# Patient Record
Sex: Male | Born: 1965 | Race: Black or African American | Hispanic: No | Marital: Single | Smoking: Current every day smoker
Health system: Southern US, Community
[De-identification: ages and names within clinical notes are randomized; demographics above are authoritative.]

## PROBLEM LIST (undated history)

## (undated) ENCOUNTER — Ambulatory Visit: Admission: EM | Payer: Self-pay

## (undated) ENCOUNTER — Ambulatory Visit: Admission: EM | Payer: 59 | Source: Home / Self Care

## (undated) DIAGNOSIS — I10 Essential (primary) hypertension: Secondary | ICD-10-CM

## (undated) DIAGNOSIS — I517 Cardiomegaly: Secondary | ICD-10-CM

## (undated) DIAGNOSIS — C801 Malignant (primary) neoplasm, unspecified: Secondary | ICD-10-CM

## (undated) DIAGNOSIS — R7611 Nonspecific reaction to tuberculin skin test without active tuberculosis: Secondary | ICD-10-CM

## (undated) DIAGNOSIS — Z86711 Personal history of pulmonary embolism: Secondary | ICD-10-CM

## (undated) DIAGNOSIS — E7800 Pure hypercholesterolemia, unspecified: Secondary | ICD-10-CM

---

## 2020-03-20 ENCOUNTER — Other Ambulatory Visit: Payer: Self-pay

## 2020-03-20 ENCOUNTER — Ambulatory Visit
Admission: EM | Admit: 2020-03-20 | Discharge: 2020-03-20 | Disposition: A | Discharge status: Home / Self Care | Payer: Self-pay | Attending: Internal Medicine | Admitting: Internal Medicine

## 2020-03-20 DIAGNOSIS — I10 Essential (primary) hypertension: Secondary | ICD-10-CM

## 2020-03-20 HISTORY — DX: Essential (primary) hypertension: I10

## 2020-03-20 HISTORY — DX: Pure hypercholesterolemia, unspecified: E78.00

## 2020-03-20 MED ORDER — ATORVASTATIN CALCIUM 10 MG PO TABS
10.0000 mg | ORAL_TABLET | Freq: Every day | ORAL | 1 refills | Status: DC
Start: 2020-03-20 — End: 2020-10-31

## 2020-03-20 MED ORDER — ATENOLOL 25 MG PO TABS: 25.0000 mg | ORAL_TABLET | Freq: Every day | ORAL | 2 refills | Status: AC

## 2020-03-20 MED ORDER — AMLODIPINE BESYLATE 10 MG PO TABS: 10.0000 mg | ORAL_TABLET | Freq: Every day | ORAL | 2 refills | Status: DC

## 2020-03-20 NOTE — ED Triage Notes (Addendum)
Pt reports he moved from [facility] and hasn't established care here. Takes blood pressure meds but lately his B/P has been creeping up and is now 170/110 today. States he would like more medication. Today his hands and lips felt tingly. States he took 6 of his pills today instead of the 1 he was supposed to take

## 2020-03-20 NOTE — ED Provider Notes (Addendum)
[facility]    CSN: [id number] Arrival date & time: 03/20/20  1533      History   Chief Complaint Chief Complaint  Patient presents with  . Hypertension    HPI Tony Graves is a 54 y.o. male with a history of high blood pressure on atenolol 25 mg orally daily comes to the urgent care for blood pressure medication adjustment.  Patient was found to have elevated blood pressure during preemployment screen.  He is compliant with his medications.  He denies any headaches, chest pain or abdominal pain.  No dizziness, near syncope or syncopal episodes.   HPI  Past Medical History:  Diagnosis Date  . High cholesterol   . Hypertension     There are no problems to display for this patient.   History reviewed. No pertinent surgical history.     Home Medications    Prior to Admission medications   Medication Sig Start Date End Date Taking? Authorizing Provider  amLODipine (NORVASC) 10 MG tablet Take 1 tablet (10 mg total) by mouth daily. 03/20/20   Chase Picket, MD  atenolol (TENORMIN) 25 MG tablet Take 1 tablet (25 mg total) by mouth daily. 03/20/20   Li Fragoso, Myrene Galas, MD  atorvastatin (LIPITOR) 10 MG tablet Take 1 tablet (10 mg total) by mouth daily. 03/20/20   LampteyMyrene Galas, MD    Family History Family History  Problem Relation Age of Onset  . Hypertension Mother   . Stroke Mother   . Cancer Father     Social History Social History   Tobacco Use  . Smoking status: Current Every Day Smoker    Packs/day: 0.50    Types: Cigarettes  . Smokeless tobacco: Never Used  Vaping Use  . Vaping Use: Never used  Substance Use Topics  . Alcohol use: Yes    Comment: social  . Drug use: Never     Allergies   Lisinopril and Penicillins   Review of Systems Review of Systems  Constitutional: Negative.   HENT: Negative.   Respiratory: Negative.   Cardiovascular: Negative.   Gastrointestinal: Negative.      Physical Exam Triage Vital Signs ED  Triage Vitals  Enc Vitals Group     BP 03/20/20 1651 (!) 188/91     Pulse Rate 03/20/20 1651 66     Resp 03/20/20 1651 18     Temp 03/20/20 1651 98.3 F (36.8 C)     Temp Source 03/20/20 1651 Oral     SpO2 03/20/20 1651 100 %     Weight 03/20/20 1652 190 lb (86.2 kg)     Height 03/20/20 1652 5\' 11"  (1.803 m)     Head Circumference --      Peak Flow --      Pain Score 03/20/20 1651 0     Pain Loc --      Pain Edu? --      Excl. in [place]? --    No data found.  Updated Vital Signs BP (!) 188/91 (BP Location: Left Arm)   Pulse 66   Temp 98.3 F (36.8 C) (Oral)   Resp 18   Ht 5\' 11"  (1.803 m)   Wt 86.2 kg   SpO2 100%   BMI 26.50 kg/m   Visual Acuity Right Eye Distance:   Left Eye Distance:   Bilateral Distance:    Right Eye Near:   Left Eye Near:    Bilateral Near:     Physical Exam Vitals and  nursing note reviewed.  Constitutional:      General: He is not in acute distress.    Appearance: Normal appearance. He is not ill-appearing.  Cardiovascular:     Rate and Rhythm: Normal rate and regular rhythm.     Pulses: Normal pulses.     Heart sounds: Normal heart sounds.  Pulmonary:     Effort: Pulmonary effort is normal.     Breath sounds: Normal breath sounds.  Musculoskeletal:     Cervical back: Normal range of motion.  Neurological:     Mental Status: He is alert.      UC Treatments / Results  Labs (all labs ordered are listed, but only abnormal results are displayed) Labs Reviewed - No data to display  EKG   Radiology No results found.  Procedures Procedures (including critical care time)  Medications Ordered in UC Medications - No data to display  Initial Impression / Assessment and Plan / UC Course  I have reviewed the triage vital signs and the nursing notes.  Pertinent labs & imaging results that were available during my care of the patient were reviewed by me and considered in my medical decision making (see chart for details).     1.   Uncontrolled hypertension: Atenolol and atorvastatin refilled Amlodipine 10 mg orally daily Return precautions given Decrease salt intake to 2 g/day Patient encouraged to exercise on a regular basis. Patient is encouraged to establish with a PCP for routine medical care. Final Clinical Impressions(s) / UC Diagnoses   Final diagnoses:  Uncontrolled hypertension     Discharge Instructions     Take medications as tolerated.   ED Prescriptions    Medication Sig Dispense Auth. Provider   amLODipine (NORVASC) 10 MG tablet Take 1 tablet (10 mg total) by mouth daily. 90 tablet Zyria Fiscus, Myrene Galas, MD   atenolol (TENORMIN) 25 MG tablet Take 1 tablet (25 mg total) by mouth daily. 90 tablet Omarius Grantham, Myrene Galas, MD   atorvastatin (LIPITOR) 10 MG tablet Take 1 tablet (10 mg total) by mouth daily. 90 tablet Taft Worthing, Myrene Galas, MD     PDMP not reviewed this encounter.   Chase Picket, MD 03/20/20 1807    Chase Picket, MD 03/20/20 [phone]

## 2020-03-20 NOTE — Discharge Instructions (Signed)
Take medications as tolerated.

## 2020-04-25 ENCOUNTER — Ambulatory Visit
Admission: EM | Admit: 2020-04-25 | Discharge: 2020-04-25 | Disposition: A | Discharge status: Home / Self Care | Payer: Self-pay | Attending: Emergency Medicine | Admitting: Emergency Medicine

## 2020-04-25 ENCOUNTER — Other Ambulatory Visit: Payer: Self-pay

## 2020-04-25 DIAGNOSIS — I10 Essential (primary) hypertension: Secondary | ICD-10-CM

## 2020-04-25 MED ORDER — HYDROCHLOROTHIAZIDE 25 MG PO TABS: 25.0000 mg | ORAL_TABLET | Freq: Every day | ORAL | 2 refills | Status: DC

## 2020-04-25 NOTE — Discharge Instructions (Addendum)
Stop taking the amlodipine.  Start taking hydrochlorothiazide, 25 mg daily.  Only take this 1 tablet once a day.  Continue your atenolol and atorvastatin.  I have made a referral to help you find a primary care provider.  If you develop chest pain, or if you pass out, you need to go to the ER for evaluation.

## 2020-04-25 NOTE — ED Triage Notes (Signed)
Patient states that he has been noticing his blood pressure was elevated last month and started doubling his BP meds. States that he has now started noticing bilateral leg swelling over the weekend. States that he does stand a lot at work too.

## 2020-04-25 NOTE — ED Provider Notes (Signed)
[facility]    CSN: [id number] Arrival date & time: 04/25/20  0831      History   Chief Complaint Chief Complaint  Patient presents with  . Leg Swelling    bilateral    HPI Tony Graves is a 54 y.o. male.   54 year old male here for evaluation of.  Patient reports that he is noticed that his blood pressure has been going up over the past month and he was taking more of his Norvasc than was prescribed. Patient reports that he would have a diastolic over 956 and was taken 2 Norvasc in the morning, 2 Norvasc the afternoon, and 2 Norvasc at night. After he started taking the increased dose of the Norvasc he began to notice swelling in his legs. Patient denies chest pain, headache, shortness of breath, dizziness, syncope. 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 Patient does not wear any compression socks. Patient does not currently have a PCP as he does not have insurance.     Past Medical History:  Diagnosis Date  . High cholesterol   . Hypertension     There are no problems to display for this patient.   History reviewed. No pertinent surgical history.     Home Medications    Prior to Admission medications   Medication Sig Start Date End Date Taking? Authorizing Provider  atenolol (TENORMIN) 25 MG tablet Take 1 tablet (25 mg total) by mouth daily. 03/20/20  Yes Lamptey, Myrene Galas, MD  atorvastatin (LIPITOR) 10 MG tablet Take 1 tablet (10 mg total) by mouth daily. 03/20/20  Yes Lamptey, Myrene Galas, MD  amLODipine (NORVASC) 10 MG tablet Take 1 tablet (10 mg total) by mouth daily. 03/20/20 04/25/20 Yes Lamptey, Myrene Galas, MD  hydrochlorothiazide (HYDRODIURIL) 25 MG tablet Take 1 tablet (25 mg total) by mouth daily. 04/25/20   Margarette Canada, NP    Family History Family History  Problem Relation Age of Onset  . Hypertension Mother   . Stroke Mother   . Cancer Father     Social History Social  History   Tobacco Use  . Smoking status: Current Every Day Smoker    Packs/day: 0.50    Types: Cigarettes  . Smokeless tobacco: Never Used  Vaping Use  . Vaping Use: Never used  Substance Use Topics  . Alcohol use: Yes    Comment: social  . Drug use: Never     Allergies   Lisinopril and Penicillins   Review of Systems Review of Systems  Constitutional: Negative for activity change, appetite change and fever.  HENT: Negative for ear pain and sore throat.   Respiratory: Negative for chest tightness, shortness of breath and wheezing.   Cardiovascular: Positive for leg swelling. Negative for chest pain and palpitations.  Musculoskeletal: Negative for joint swelling.  Skin: Negative.   Neurological: Negative for dizziness, syncope, weakness, light-headedness, numbness and headaches.  Hematological: Negative.   Psychiatric/Behavioral: Negative.      Physical Exam Triage Vital Signs ED Triage Vitals  Enc Vitals Group     BP 04/25/20 0906 (!) 140/96     Pulse Rate 04/25/20 0906 61     Resp 04/25/20 0906 17     Temp 04/25/20 0906 98.1 F (36.7 C)     Temp Source 04/25/20 0906 Oral     SpO2 04/25/20 0906 99 %     Weight 04/25/20 0905 190 lb 0.6 oz (86.2  kg)     Height 04/25/20 0905 5\' 11"  (1.803 m)     Head Circumference --      Peak Flow --      Pain Score 04/25/20 0905 0     Pain Loc --      Pain Edu? --      Excl. in [place]? --    No data found.  Updated Vital Signs BP (!) 140/96 (BP Location: Right Arm)   Pulse 61   Temp 98.1 F (36.7 C) (Oral)   Resp 17   Ht 5\' 11"  (1.803 m)   Wt 190 lb 0.6 oz (86.2 kg)   SpO2 99%   BMI 26.50 kg/m   Visual Acuity Right Eye Distance:   Left Eye Distance:   Bilateral Distance:    Right Eye Near:   Left Eye Near:    Bilateral Near:     Physical Exam Vitals and nursing note reviewed.  Constitutional:      General: He is not in acute distress.    Appearance: Normal appearance. He is normal weight. He is not  toxic-appearing.  HENT:     Head: Normocephalic and atraumatic.  Eyes:     General: No scleral icterus.    Extraocular Movements: Extraocular movements intact.     Conjunctiva/sclera: Conjunctivae normal.     Pupils: Pupils are equal, round, and reactive to light.  Neck:     Vascular: No carotid bruit.  Cardiovascular:     Rate and Rhythm: Normal rate and regular rhythm.     Pulses: Normal pulses.     Heart sounds: Normal heart sounds. No murmur heard.  No friction rub. No gallop.   Pulmonary:     Effort: Pulmonary effort is normal.     Breath sounds: Normal breath sounds. No wheezing or rales.  Musculoskeletal:        General: No tenderness. Normal range of motion.     Cervical back: Normal range of motion and neck supple.     Right lower leg: Edema present.     Left lower leg: Edema present.     Comments: Patient has 1+ pitting edema in bilateral lower extremities. The edema extends to mid tibia bilaterally.  Skin:    General: Skin is warm and dry.     Capillary Refill: Capillary refill takes less than 2 seconds.  Neurological:     General: No focal deficit present.     Mental Status: He is alert and oriented to person, place, and time.     Cranial Nerves: No cranial nerve deficit.     Sensory: No sensory deficit.     Motor: No weakness.     Coordination: Coordination normal.     Gait: Gait normal.  Psychiatric:        Mood and Affect: Mood normal.        Behavior: Behavior normal.        Thought Content: Thought content normal.        Judgment: Judgment normal.      UC Treatments / Results  Labs (all labs ordered are listed, but only abnormal results are displayed) Labs Reviewed - No data to display  EKG   Radiology No results found.  Procedures Procedures (including critical care time)  Medications Ordered in UC Medications - No data to display  Initial Impression / Assessment and Plan / UC Course  I have reviewed the triage vital signs and the  nursing notes.  Pertinent labs & imaging  results that were available during my care of the patient were reviewed by me and considered in my medical decision making (see chart for details).   Patient is here for evaluation of bilateral lower extremity swelling after starting Norvasc. Patient states that his blood pressure had been elevated to the point of a diastolic over 762 so he was doubling and tripling up on his Norvasc. Patient denies rash, syncope, chest pain, headache, dizziness, shortness of breath. Patient reports that he has resumed taking the Norvasc as prescribed, 1 tablet daily. Patient is also taking atenolol 25 mg daily and atorvastatin. Patient has not yet established with a primary care provider as he is still awaiting to qualify for insurance with his job. We'll send a referral to assist him in finding a primary care provider. Will obtain EKG to look for signs of arrhythmia or formation of heart block. Will DC amlodipine and start patient on hydrochlorothiazide 25 mg daily. Patient will be referred for assistance in finding a PCP.   EKG shows sinus bradycardia with a rate of 55.  There are no ST changes or AV blocks present on EKG.  EKG is normal axis.  PR interval is at the maximum limit of normal.  Final Clinical Impressions(s) / UC Diagnoses   Final diagnoses:  Primary hypertension     Discharge Instructions     Stop taking the amlodipine.  Start taking hydrochlorothiazide, 25 mg daily.  Only take this 1 tablet once a day.  Continue your atenolol and atorvastatin.  I have made a referral to help you find a primary care provider.  If you develop chest pain, or if you pass out, you need to go to the ER for evaluation.    ED Prescriptions    Medication Sig Dispense Auth. Provider   hydrochlorothiazide (HYDRODIURIL) 25 MG tablet Take 1 tablet (25 mg total) by mouth daily. 30 tablet Margarette Canada, NP     PDMP not reviewed this encounter.   Margarette Canada, NP 04/25/20  1002

## 2020-07-14 ENCOUNTER — Telehealth: Payer: Self-pay

## 2020-07-14 MED ORDER — HYDROCHLOROTHIAZIDE 25 MG PO TABS
25.0000 mg | ORAL_TABLET | Freq: Every day | ORAL | 0 refills | Status: DC
Start: 2020-07-14 — End: 2020-09-24

## 2020-09-24 ENCOUNTER — Other Ambulatory Visit: Payer: Self-pay

## 2020-09-24 ENCOUNTER — Encounter: Payer: Self-pay | Admitting: Emergency Medicine

## 2020-09-24 ENCOUNTER — Ambulatory Visit
Admission: EM | Admit: 2020-09-24 | Discharge: 2020-09-24 | Disposition: A | Discharge status: Home / Self Care | Payer: 59 | Attending: Internal Medicine | Admitting: Internal Medicine

## 2020-09-24 DIAGNOSIS — E871 Hypo-osmolality and hyponatremia: Secondary | ICD-10-CM | POA: Diagnosis present

## 2020-09-24 DIAGNOSIS — I10 Essential (primary) hypertension: Secondary | ICD-10-CM | POA: Diagnosis not present

## 2020-09-24 LAB — CBC WITH DIFFERENTIAL/PLATELET
Abs Immature Granulocytes: 0.01 10*3/uL (ref 0.00–0.07)
Basophils Absolute: 0 10*3/uL (ref 0.0–0.1)
Basophils Relative: 1 %
Eosinophils Absolute: 0.1 10*3/uL (ref 0.0–0.5)
Eosinophils Relative: 2 %
HCT: 42.5 % (ref 39.0–52.0)
Hemoglobin: 14.4 g/dL (ref 13.0–17.0)
Immature Granulocytes: 0 %
Lymphocytes Relative: 47 %
Lymphs Abs: 2.5 10*3/uL (ref 0.7–4.0)
MCH: 31.2 pg (ref 26.0–34.0)
MCHC: 33.9 g/dL (ref 30.0–36.0)
MCV: 92 fL (ref 80.0–100.0)
Monocytes Absolute: 0.4 10*3/uL (ref 0.1–1.0)
Monocytes Relative: 8 %
Neutro Abs: 2.3 10*3/uL (ref 1.7–7.7)
Neutrophils Relative %: 42 %
Platelets: 252 10*3/uL (ref 150–400)
RBC: 4.62 MIL/uL (ref 4.22–5.81)
RDW: 14.6 % (ref 11.5–15.5)
WBC: 5.3 10*3/uL (ref 4.0–10.5)
nRBC: 0 % (ref 0.0–0.2)

## 2020-09-24 LAB — COMPREHENSIVE METABOLIC PANEL
ALT: 30 U/L (ref 0–44)
AST: 39 U/L (ref 15–41)
Albumin: 4.2 g/dL (ref 3.5–5.0)
Alkaline Phosphatase: 60 U/L (ref 38–126)
Anion gap: 7 (ref 5–15)
BUN: 23 mg/dL — ABNORMAL HIGH (ref 6–20)
CO2: 29 mmol/L (ref 22–32)
Calcium: 9.3 mg/dL (ref 8.9–10.3)
Chloride: 98 mmol/L (ref 98–111)
Creatinine, Ser: 1.13 mg/dL (ref 0.61–1.24)
GFR, Estimated: >60 mL/min (ref >60)
Glucose, Bld: 84 mg/dL (ref 70–99)
Potassium: 3.7 mmol/L (ref 3.5–5.1)
Sodium: 134 mmol/L — ABNORMAL LOW (ref 135–145)
Total Bilirubin: 0.6 mg/dL (ref 0.3–1.2)
Total Protein: 7.6 g/dL (ref 6.5–8.1)

## 2020-09-24 MED ORDER — HYDROCHLOROTHIAZIDE 25 MG PO TABS
25.0000 mg | ORAL_TABLET | Freq: Every day | ORAL | 0 refills | Status: DC
Start: 2020-09-24 — End: 2020-10-31

## 2020-09-24 NOTE — Discharge Instructions (Addendum)
Your sodium is a little low at 134, normal is 135-145, so if you are drinking a lot of water cut down some.  Your  potasium, kidney and liver function and cell count is normal.

## 2020-09-24 NOTE — ED Triage Notes (Signed)
Pt presents to [place] for a refill on HCTZ. He states he has made an appt with a PCP but it is not until next month and he will run out of medications.

## 2020-09-24 NOTE — ED Provider Notes (Signed)
[facility]    CSN: [id number] Arrival date & time: 09/24/20  1761      History   Chief Complaint Chief Complaint  Patient presents with  . Medication Refill    HPI Tony Graves is a 55 y.o. male who needs HCTZ rx filled  Has apt with PCP 5/13, he is fine with his Tenormin. His BP are usually 120's/80's. Denies edema since he has been on the HCTZ. He does not drink much water.   Past Medical History:  Diagnosis Date  . High cholesterol   . Hypertension     There are no problems to display for this patient.   History reviewed. No pertinent surgical history.   Home Medications    Prior to Admission medications   Medication Sig Start Date End Date Taking? Authorizing Provider  aspirin EC 81 MG tablet Take 81 mg by mouth daily. Swallow whole.   Yes [provider]  atenolol (TENORMIN) 25 MG tablet Take 1 tablet (25 mg total) by mouth daily. 03/20/20  Yes Lamptey, Myrene Galas, MD  atorvastatin (LIPITOR) 10 MG tablet Take 1 tablet (10 mg total) by mouth daily. 03/20/20  Yes Lamptey, Myrene Galas, MD  hydrochlorothiazide (HYDRODIURIL) 25 MG tablet Take 1 tablet (25 mg total) by mouth daily. 09/24/20   Rodriguez-Southworth, Sunday Spillers, PA-C  amLODipine (NORVASC) 10 MG tablet Take 1 tablet (10 mg total) by mouth daily. 03/20/20 04/25/20  Chase Picket, MD    Family History Family History  Problem Relation Age of Onset  . Hypertension Mother   . Stroke Mother   . Cancer Father     Social History Social History   Tobacco Use  . Smoking status: Current Every Day Smoker    Packs/day: 0.50    Types: Cigarettes  . Smokeless tobacco: Never Used  Vaping Use  . Vaping Use: Never used  Substance Use Topics  . Alcohol use: Yes    Comment: social  . Drug use: Never     Allergies   Lisinopril and Penicillins   Review of Systems Review of Systems Review of Systems  Constitutional: Negative for diaphoresis and unexpected weight change.  HENT: Negative  for tinnitus.   Eyes: Negative for visual disturbance.  Respiratory: Negative for chest tightness and shortness of breath.   Cardiovascular: Negative for chest pain, palpitations and leg swelling.  Gastrointestinal: Negative for constipation, diarrhea and nausea.  Endocrine: Negative for polydipsia, polyphagia and polyuria.  Genitourinary: Negative for dysuria and frequency.  Skin: Negative for rash and wound.  Neurological: Negative for dizziness, speech difficulty, weakness, numbness and headaches.    Physical Exam Triage Vital Signs ED Triage Vitals  Enc Vitals Group     BP 09/24/20 1000 (!) 134/97     Pulse Rate 09/24/20 1000 (!) 56     Resp 09/24/20 1000 18     Temp 09/24/20 1000 98.5 F (36.9 C)     Temp Source 09/24/20 1000 Oral     SpO2 09/24/20 1000 99 %     Weight 09/24/20 0958 190 lb 0.6 oz (86.2 kg)     Height 09/24/20 0958 5\' 11"  (1.803 m)     Head Circumference --      Peak Flow --      Pain Score 09/24/20 0958 0     Pain Loc --      Pain Edu? --      Excl. in [place]? --    No data found.  Updated Vital Signs BP Marland Kitchen)  134/97 (BP Location: Left Arm)   Pulse (!) 56   Temp 98.5 F (36.9 C) (Oral)   Resp 18   Ht 5\' 11"  (1.803 m)   Wt 190 lb 0.6 oz (86.2 kg)   SpO2 99%   BMI 26.50 kg/m   Visual Acuity Right Eye Distance:   Left Eye Distance:   Bilateral Distance:    Right Eye Near:   Left Eye Near:    Bilateral Near:     Physical Exam  Constitutional: he is oriented to person, place, and time. he appears well-developed and well-nourished. No distress.  HENT:  Head: Normocephalic and atraumatic.  Right Ear: External ear normal.  Left Ear: External ear normal.  Nose: Nose normal.  Eyes: Conjunctivae are normal. Right eye exhibits no discharge. Left eye exhibits no discharge. No scleral icterus.  Neck: Neck supple. No thyromegaly present.  No carotid bruits bilaterally  Cardiovascular: Normal rate and regular rhythm.  No murmur  heard. Pulmonary/Chest: Effort normal and breath sounds normal. No respiratory distress.  Musculoskeletal: Normal range of motion. He exhibits no edema.  Lymphadenopathy:    he has no cervical adenopathy.  Neurological: He is alert and oriented to person, place, and time.  Skin: Skin is warm and dry. Capillary refill takes less than 2 seconds. No rash noted. She is not diaphoretic.  Psychiatric: he has a normal mood and affect. Her behavior is normal. Judgment and thought content normal.  Nursing note reviewed.   UC Treatments / Results  Labs (all labs ordered are listed, but only abnormal results are displayed) Labs Reviewed  COMPREHENSIVE METABOLIC PANEL - Abnormal; Notable for the following components:      Result Value   Sodium 134 (*)    BUN 23 (*)    All other components within normal limits  CBC WITH DIFFERENTIAL/PLATELET    EKG   Radiology No results found.  Procedures Procedures (including critical care time)  Medications Ordered in UC Medications - No data to display  Initial Impression / Assessment and Plan / UC Course  I have reviewed the triage vital signs and the nursing notes. Pertinent labs results that were available during my care of the patient were reviewed by me and considered in my medical decision making (see chart for details). I refilled his HCTZ for one month and needs to keep apt with PCP as scheduled.  Final Clinical Impressions(s) / UC Diagnoses   Final diagnoses:  Primary hypertension  Hyponatremia     Discharge Instructions     Your sodium is a little low at 134, normal is 135-145, so if you are drinking a lot of water cut down some.  Your  potasium, kidney and liver function and cell count is normal.     ED Prescriptions    Medication Sig Dispense Auth. Provider   hydrochlorothiazide (HYDRODIURIL) 25 MG tablet Take 1 tablet (25 mg total) by mouth daily. 30 tablet Rodriguez-Southworth, Sunday Spillers, PA-C     PDMP not reviewed this  encounter.   Shelby Mattocks, PA-C 09/24/20 1118

## 2020-10-31 ENCOUNTER — Telehealth: Payer: Self-pay | Admitting: Family Medicine

## 2020-10-31 MED ORDER — ATORVASTATIN CALCIUM 10 MG PO TABS: 10.0000 mg | ORAL_TABLET | Freq: Every day | ORAL | 0 refills | Status: AC

## 2020-10-31 MED ORDER — ATENOLOL 25 MG PO TABS: 25.0000 mg | ORAL_TABLET | Freq: Every day | ORAL | 0 refills | Status: DC

## 2020-10-31 MED ORDER — HYDROCHLOROTHIAZIDE 25 MG PO TABS
25.0000 mg | ORAL_TABLET | Freq: Every day | ORAL | 0 refills | Status: AC
Start: 2020-10-31 — End: ?

## 2020-10-31 NOTE — Telephone Encounter (Signed)
Patient recently seen and requesting refills on his medications until he can be seen by PCP.  Sending in meds.  [facility]

## 2020-11-25 ENCOUNTER — Encounter: Payer: Self-pay | Admitting: Emergency Medicine

## 2020-11-25 ENCOUNTER — Other Ambulatory Visit: Payer: Self-pay

## 2020-11-25 ENCOUNTER — Ambulatory Visit
Admission: EM | Admit: 2020-11-25 | Discharge: 2020-11-25 | Disposition: A | Discharge status: Home / Self Care | Payer: 59 | Attending: Physician Assistant | Admitting: Physician Assistant

## 2020-11-25 DIAGNOSIS — S61217A Laceration without foreign body of left little finger without damage to nail, initial encounter: Secondary | ICD-10-CM

## 2020-11-25 DIAGNOSIS — Z23 Encounter for immunization: Secondary | ICD-10-CM | POA: Diagnosis not present

## 2020-11-25 MED ORDER — TETANUS-DIPHTH-ACELL PERTUSSIS 5-2.5-18.5 LF-MCG/0.5 IM SUSY
0.5000 mL | PREFILLED_SYRINGE | Freq: Once | INTRAMUSCULAR | Status: AC
  Administered 2020-11-25: 16:00:00 0.5 mL via INTRAMUSCULAR

## 2020-11-25 NOTE — ED Triage Notes (Signed)
Patient states that he cut his left 5th finger on a metal sign about 3 min ago.  Patient has laceration to his right 5th finger.

## 2020-11-25 NOTE — Discharge Instructions (Addendum)
Keep dressing in place overnight.  Tomorrow can start cleansing wound daily with soap and water.  Keep splint in place to protect the sutures. Keep covered to keep clean.  Return in 10 days to remove sutures.  Return for any signs of infection- redness, swelling, warmth, pus drainage.  Ibuprofen as needed for pain.  Nice to meet you!

## 2020-11-25 NOTE — ED Provider Notes (Signed)
[facility]    CSN: [id number] Arrival date & time: 11/25/20  1554      History   Chief Complaint Chief Complaint  Patient presents with   Extremity Laceration    Left 5th finger    HPI Tony Graves is a 55 y.o. male.   Tony Graves presents with complaints of laceration to left pinky finger immediately prior to arrival. Accidentally cut it on a metal sign while trying to kill a snake. No numbness or tingling. No active bleeding. Unknown last tdap. No underlying pain. Hasn't cleansed the wound yet. He is not on a blood thinner.    ROS per HPI, negative if not otherwise mentioned.     Past Medical History:  Diagnosis Date   High cholesterol    Hypertension     There are no problems to display for this patient.   History reviewed. No pertinent surgical history.     Home Medications    Prior to Admission medications   Medication Sig Start Date End Date Taking? Authorizing Provider  aspirin EC 81 MG tablet Take 81 mg by mouth daily. Swallow whole.   Yes [provider]  atenolol (TENORMIN) 25 MG tablet Take 1 tablet (25 mg total) by mouth daily. 03/20/20  Yes Lamptey, Myrene Galas, MD  atorvastatin (LIPITOR) 10 MG tablet Take 1 tablet (10 mg total) by mouth daily. 10/31/20  Yes Cook, Jayce G, DO  hydrochlorothiazide (HYDRODIURIL) 25 MG tablet Take 1 tablet (25 mg total) by mouth daily. 10/31/20  Yes Cook, Jayce G, DO  atenolol (TENORMIN) 25 MG tablet Take 1 tablet (25 mg total) by mouth daily. 10/31/20   Coral Spikes, DO  amLODipine (NORVASC) 10 MG tablet Take 1 tablet (10 mg total) by mouth daily. 03/20/20 04/25/20  LampteyMyrene Galas, MD    Family History Family History  Problem Relation Age of Onset   Hypertension Mother    Stroke Mother    Cancer Father     Social History Social History   Tobacco Use   Smoking status: Every Day    Packs/day: 0.50    Pack years: 0.00    Types: Cigarettes   Smokeless tobacco: Never  Vaping Use    Vaping Use: Never used  Substance Use Topics   Alcohol use: Yes    Comment: social   Drug use: Never     Allergies   Lisinopril and Penicillins   Review of Systems Review of Systems   Physical Exam Triage Vital Signs ED Triage Vitals  Enc Vitals Group     BP 11/25/20 1613 (!) 143/89     Pulse Rate 11/25/20 1613 67     Resp 11/25/20 1613 16     Temp 11/25/20 1613 98.2 F (36.8 C)     Temp Source 11/25/20 1613 Oral     SpO2 11/25/20 1613 97 %     Weight 11/25/20 1612 189 lb (85.7 kg)     Height 11/25/20 1612 5\' 11"  (1.803 m)     Head Circumference --      Peak Flow --      Pain Score 11/25/20 1612 0     Pain Loc --      Pain Edu? --      Excl. in [place]? --    No data found.  Updated Vital Signs BP (!) 143/89 (BP Location: Left Arm)   Pulse 67   Temp 98.2 F (36.8 C) (Oral)   Resp 16   Ht  5\' 11"  (1.803 m)   Wt 189 lb (85.7 kg)   SpO2 97%   BMI 26.36 kg/m   Visual Acuity Right Eye Distance:   Left Eye Distance:   Bilateral Distance:    Right Eye Near:   Left Eye Near:    Bilateral Near:     Physical Exam Constitutional:      Appearance: He is well-developed.  Cardiovascular:     Rate and Rhythm: Normal rate.  Pulmonary:     Effort: Pulmonary effort is normal.  Musculoskeletal:     Left hand: Laceration present. No swelling, tenderness or bony tenderness. Normal range of motion. Normal strength. Normal sensation. Normal capillary refill.     Comments: 1.5-2 cm laceration overlying left hand pinky PIP joint horizontally; approximately 9mm in depth but with close approximation  Skin:    General: Skin is warm and dry.  Neurological:     Mental Status: He is alert and oriented to person, place, and time.     UC Treatments / Results  Labs (all labs ordered are listed, but only abnormal results are displayed) Labs Reviewed - No data to display  EKG   Radiology No results found.  Procedures Laceration Repair  Date/Time: 11/25/2020 4:46  PM Performed by: Zigmund Gottron, NP Authorized by: Zigmund Gottron, NP   Consent:    Consent obtained:  Verbal   Consent given by:  Patient   Risks, benefits, and alternatives were discussed: yes     Risks discussed:  Infection, pain, poor cosmetic result and need for additional repair   Alternatives discussed:  Referral Universal protocol:    Procedure explained and questions answered to patient or proxy's satisfaction: yes     Patient identity confirmed:  Verbally with patient Anesthesia:    Anesthesia method:  Local infiltration   Local anesthetic:  Lidocaine 1% w/o epi Laceration details:    Location:  Finger   Finger location:  L small finger   Length (cm):  2   Depth (mm):  4 Pre-procedure details:    Preparation:  Patient was prepped and draped in usual sterile fashion Exploration:    Imaging outcome: foreign body not noted     Wound exploration: wound explored through full range of motion and entire depth of wound visualized     Wound extent: no foreign bodies/material noted, no muscle damage noted, no nerve damage noted, no underlying fracture noted and no vascular damage noted   Treatment:    Area cleansed with:  Povidone-iodine, saline and soap and water   Amount of cleaning:  Extensive Skin repair:    Repair method:  Sutures   Suture size:  5-0   Suture material:  Nylon   Suture technique:  Simple interrupted   Number of sutures:  3 Approximation:    Approximation:  Close Repair type:    Repair type:  Simple Post-procedure details:    Dressing:  Non-adherent dressing and splint for protection   Procedure completion:  Tolerated (including critical care time)  Medications Ordered in UC Medications  Tdap (BOOSTRIX) injection 0.5 mL (0.5 mLs Intramuscular Given 11/25/20 1614)    Initial Impression / Assessment and Plan / UC Course  I have reviewed the triage vital signs and the nursing notes.  Pertinent labs & imaging results that were available during my  care of the patient were reviewed by me and considered in my medical decision making (see chart for details).     Finger laceration, entire wound visualized,  imaging deferred. close approximation with 3 sutures and splint placed. Return in ~10 days for removal. Return precautions provided. Patient verbalized understanding and agreeable to plan.   Final Clinical Impressions(s) / UC Diagnoses   Final diagnoses:  Laceration of left little finger without foreign body without damage to nail, initial encounter     Discharge Instructions      Keep dressing in place overnight.  Tomorrow can start cleansing wound daily with soap and water.  Keep splint in place to protect the sutures. Keep covered to keep clean.  Return in 10 days to remove sutures.  Return for any signs of infection- redness, swelling, warmth, pus drainage.  Ibuprofen as needed for pain.  Nice to meet you!    ED Prescriptions   None    PDMP not reviewed this encounter.   Zigmund Gottron, NP 11/25/20 [phone]

## 2020-11-28 ENCOUNTER — Emergency Department: Payer: 59

## 2020-11-28 ENCOUNTER — Encounter: Payer: Self-pay | Admitting: Emergency Medicine

## 2020-11-28 ENCOUNTER — Emergency Department
Admission: EM | Admit: 2020-11-28 | Discharge: 2020-11-28 | Disposition: A | Discharge status: Home / Self Care | Payer: 59 | Attending: Emergency Medicine | Admitting: Emergency Medicine

## 2020-11-28 ENCOUNTER — Other Ambulatory Visit: Payer: Self-pay

## 2020-11-28 DIAGNOSIS — F17210 Nicotine dependence, cigarettes, uncomplicated: Secondary | ICD-10-CM | POA: Diagnosis not present

## 2020-11-28 DIAGNOSIS — S199XXA Unspecified injury of neck, initial encounter: Secondary | ICD-10-CM | POA: Diagnosis present

## 2020-11-28 DIAGNOSIS — I10 Essential (primary) hypertension: Secondary | ICD-10-CM | POA: Diagnosis not present

## 2020-11-28 DIAGNOSIS — Y92410 Unspecified street and highway as the place of occurrence of the external cause: Secondary | ICD-10-CM | POA: Diagnosis not present

## 2020-11-28 DIAGNOSIS — S161XXA Strain of muscle, fascia and tendon at neck level, initial encounter: Secondary | ICD-10-CM | POA: Diagnosis not present

## 2020-11-28 DIAGNOSIS — Z79899 Other long term (current) drug therapy: Secondary | ICD-10-CM | POA: Insufficient documentation

## 2020-11-28 DIAGNOSIS — S66911A Strain of unspecified muscle, fascia and tendon at wrist and hand level, right hand, initial encounter: Secondary | ICD-10-CM | POA: Diagnosis not present

## 2020-11-28 DIAGNOSIS — Z7982 Long term (current) use of aspirin: Secondary | ICD-10-CM | POA: Diagnosis not present

## 2020-11-28 DIAGNOSIS — M7918 Myalgia, other site: Secondary | ICD-10-CM

## 2020-11-28 DIAGNOSIS — S63501A Unspecified sprain of right wrist, initial encounter: Secondary | ICD-10-CM

## 2020-11-28 MED ORDER — CYCLOBENZAPRINE HCL 10 MG PO TABS
10.0000 mg | ORAL_TABLET | Freq: Once | ORAL | Status: AC
  Administered 2020-11-28: 10:00:00 10 mg via ORAL
  Filled 2020-11-28: qty 1

## 2020-11-28 MED ORDER — OXYCODONE-ACETAMINOPHEN 7.5-325 MG PO TABS: 1.0000 | ORAL_TABLET | Freq: Four times a day (QID) | ORAL | 0 refills | Status: DC | PRN

## 2020-11-28 MED ORDER — IBUPROFEN 800 MG PO TABS: 800.0000 mg | ORAL_TABLET | Freq: Three times a day (TID) | ORAL | 0 refills | Status: DC | PRN

## 2020-11-28 MED ORDER — IBUPROFEN 600 MG PO TABS
600.0000 mg | ORAL_TABLET | Freq: Once | ORAL | Status: AC
  Administered 2020-11-28: 10:00:00 600 mg via ORAL
  Filled 2020-11-28: qty 1

## 2020-11-28 MED ORDER — OXYCODONE-ACETAMINOPHEN 5-325 MG PO TABS
1.0000 | ORAL_TABLET | Freq: Once | ORAL | Status: AC
  Administered 2020-11-28: 10:00:00 1 via ORAL
  Filled 2020-11-28: qty 1

## 2020-11-28 MED ORDER — CYCLOBENZAPRINE HCL 10 MG PO TABS: 10.0000 mg | ORAL_TABLET | Freq: Three times a day (TID) | ORAL | 0 refills | Status: DC | PRN
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
## 2020-11-28 NOTE — Discharge Instructions (Addendum)
No acute findings on CT of the head and neck.  Read and follow discharge care instructions.  Wear wrist splint for 3 to 5 days as needed.  Take medication as directed.
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
## 2020-11-29 ENCOUNTER — Emergency Department
Admission: EM | Admit: 2020-11-29 | Discharge: 2020-11-29 | Disposition: A | Discharge status: Home / Self Care | Payer: 59 | Attending: Emergency Medicine | Admitting: Emergency Medicine

## 2020-11-29 ENCOUNTER — Other Ambulatory Visit: Payer: Self-pay

## 2020-11-29 ENCOUNTER — Emergency Department: Payer: 59

## 2020-11-29 DIAGNOSIS — I10 Essential (primary) hypertension: Secondary | ICD-10-CM | POA: Diagnosis not present

## 2020-11-29 DIAGNOSIS — Y92410 Unspecified street and highway as the place of occurrence of the external cause: Secondary | ICD-10-CM | POA: Diagnosis not present

## 2020-11-29 DIAGNOSIS — Z7982 Long term (current) use of aspirin: Secondary | ICD-10-CM | POA: Insufficient documentation

## 2020-11-29 DIAGNOSIS — F17210 Nicotine dependence, cigarettes, uncomplicated: Secondary | ICD-10-CM | POA: Diagnosis not present

## 2020-11-29 DIAGNOSIS — M545 Low back pain, unspecified: Secondary | ICD-10-CM | POA: Insufficient documentation

## 2020-11-29 DIAGNOSIS — Z79899 Other long term (current) drug therapy: Secondary | ICD-10-CM | POA: Diagnosis not present

## 2020-11-29 NOTE — Discharge Instructions (Addendum)
Continue taking medications prescribed yesterday for pain.
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
## 2020-12-01 ENCOUNTER — Encounter: Payer: Self-pay | Admitting: Emergency Medicine

## 2020-12-01 ENCOUNTER — Emergency Department
Admission: EM | Admit: 2020-12-01 | Discharge: 2020-12-01 | Disposition: A | Discharge status: Home / Self Care | Payer: 59 | Attending: Emergency Medicine | Admitting: Emergency Medicine

## 2020-12-01 ENCOUNTER — Other Ambulatory Visit: Payer: Self-pay

## 2020-12-01 DIAGNOSIS — F17210 Nicotine dependence, cigarettes, uncomplicated: Secondary | ICD-10-CM | POA: Insufficient documentation

## 2020-12-01 DIAGNOSIS — M7918 Myalgia, other site: Secondary | ICD-10-CM

## 2020-12-01 DIAGNOSIS — Z79899 Other long term (current) drug therapy: Secondary | ICD-10-CM | POA: Insufficient documentation

## 2020-12-01 DIAGNOSIS — M545 Low back pain, unspecified: Secondary | ICD-10-CM | POA: Diagnosis present

## 2020-12-01 DIAGNOSIS — Z7982 Long term (current) use of aspirin: Secondary | ICD-10-CM | POA: Insufficient documentation

## 2020-12-01 DIAGNOSIS — Y92410 Unspecified street and highway as the place of occurrence of the external cause: Secondary | ICD-10-CM | POA: Insufficient documentation

## 2020-12-01 DIAGNOSIS — I10 Essential (primary) hypertension: Secondary | ICD-10-CM | POA: Insufficient documentation

## 2020-12-01 MED ORDER — LIDOCAINE 5 % EX PTCH: 1.0000 | MEDICATED_PATCH | Freq: Two times a day (BID) | CUTANEOUS | 0 refills | Status: AC

## 2020-12-01 MED ORDER — KETOROLAC TROMETHAMINE 60 MG/2ML IM SOLN
60.0000 mg | Freq: Once | INTRAMUSCULAR | Status: AC
  Administered 2020-12-01: 08:00:00 60 mg via INTRAMUSCULAR
  Filled 2020-12-01: qty 2

## 2020-12-01 MED ORDER — ORPHENADRINE CITRATE ER 100 MG PO TB12: 100.0000 mg | ORAL_TABLET | Freq: Two times a day (BID) | ORAL | 1 refills | Status: AC

## 2020-12-01 MED ORDER — LIDOCAINE 5 % EX PTCH
1.0000 | MEDICATED_PATCH | CUTANEOUS | Status: DC
  Administered 2020-12-01: 08:00:00 1 via TRANSDERMAL
  Filled 2020-12-01: qty 1

## 2020-12-01 NOTE — Discharge Instructions (Addendum)
Read and follow discharge care instructions.  Take medication as directed.
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
## 2020-12-04 ENCOUNTER — Other Ambulatory Visit: Payer: Self-pay

## 2020-12-04 ENCOUNTER — Ambulatory Visit
Admission: EM | Admit: 2020-12-04 | Discharge: 2020-12-04 | Disposition: A | Discharge status: Home / Self Care | Payer: 59 | Attending: Family Medicine | Admitting: Family Medicine

## 2020-12-04 DIAGNOSIS — T22112A Burn of first degree of left forearm, initial encounter: Secondary | ICD-10-CM

## 2020-12-04 DIAGNOSIS — M545 Low back pain, unspecified: Secondary | ICD-10-CM | POA: Diagnosis not present

## 2020-12-04 DIAGNOSIS — Z4802 Encounter for removal of sutures: Secondary | ICD-10-CM | POA: Diagnosis not present

## 2020-12-04 MED ORDER — KETOROLAC TROMETHAMINE 60 MG/2ML IM SOLN
60.0000 mg | Freq: Once | INTRAMUSCULAR | Status: AC
  Administered 2020-12-04: 60 mg via INTRAMUSCULAR
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
## 2020-12-04 NOTE — Discharge Instructions (Addendum)
Rest.  Heat to the low back.  The injection should help with the pain.  Best of luck  Dr. Lacinda Axon
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
## 2020-12-10 ENCOUNTER — Other Ambulatory Visit: Payer: Self-pay

## 2020-12-10 ENCOUNTER — Ambulatory Visit
Admission: EM | Admit: 2020-12-10 | Discharge: 2020-12-10 | Disposition: A | Discharge status: Home / Self Care | Payer: 59 | Attending: Emergency Medicine | Admitting: Emergency Medicine

## 2020-12-10 DIAGNOSIS — M545 Low back pain, unspecified: Secondary | ICD-10-CM | POA: Diagnosis not present

## 2020-12-10 MED ORDER — KETOROLAC TROMETHAMINE 10 MG PO TABS: 10.0000 mg | ORAL_TABLET | Freq: Four times a day (QID) | ORAL | 0 refills | Status: DC | PRN

## 2020-12-10 MED ORDER — KETOROLAC TROMETHAMINE 60 MG/2ML IM SOLN
60.0000 mg | Freq: Once | INTRAMUSCULAR | Status: AC
  Administered 2020-12-10: 17:00:00 60 mg via INTRAMUSCULAR

## 2020-12-10 NOTE — ED Triage Notes (Signed)
Patient presents to Urgent Care with complaints of continued back pain since last visit 06/21. He is here for a follow-up visit  he states he does not tolerate the oxy and flexeril meds prescribed to him. He also wants his left pinky finger evaluated sutures were removed 06/21.   Denies fever.

## 2020-12-10 NOTE — Discharge Instructions (Addendum)
Take the Toradol every 6 hours with food as needed for continued pain.  Follow the home physical therapy exercises in your discharge paperwork.  I have made a referral for you to physical therapy here at [facility] so be looking for their call.
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
## 2020-12-18 ENCOUNTER — Ambulatory Visit: Payer: 59 | Attending: Emergency Medicine | Admitting: Physical Therapy

## 2020-12-18 ENCOUNTER — Encounter: Payer: Self-pay | Admitting: Physical Therapy

## 2020-12-18 ENCOUNTER — Other Ambulatory Visit: Payer: Self-pay

## 2020-12-18 DIAGNOSIS — M6281 Muscle weakness (generalized): Secondary | ICD-10-CM | POA: Diagnosis present

## 2020-12-18 DIAGNOSIS — M545 Low back pain, unspecified: Secondary | ICD-10-CM | POA: Diagnosis present

## 2020-12-18 DIAGNOSIS — M5386 Other specified dorsopathies, lumbar region: Secondary | ICD-10-CM | POA: Insufficient documentation
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
## 2020-12-20 ENCOUNTER — Ambulatory Visit: Payer: 59 | Admitting: Physical Therapy

## 2020-12-20 ENCOUNTER — Other Ambulatory Visit: Payer: Self-pay

## 2020-12-20 DIAGNOSIS — M5386 Other specified dorsopathies, lumbar region: Secondary | ICD-10-CM

## 2020-12-20 DIAGNOSIS — M545 Low back pain, unspecified: Secondary | ICD-10-CM | POA: Diagnosis not present

## 2020-12-20 DIAGNOSIS — M6281 Muscle weakness (generalized): Secondary | ICD-10-CM
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
## 2020-12-21 ENCOUNTER — Encounter: Payer: Self-pay | Admitting: Physical Therapy

## 2020-12-25 ENCOUNTER — Ambulatory Visit: Payer: 59 | Admitting: Physical Therapy

## 2020-12-25 ENCOUNTER — Other Ambulatory Visit: Payer: Self-pay

## 2020-12-25 DIAGNOSIS — M6281 Muscle weakness (generalized): Secondary | ICD-10-CM

## 2020-12-25 DIAGNOSIS — M545 Low back pain, unspecified: Secondary | ICD-10-CM

## 2020-12-25 DIAGNOSIS — M5386 Other specified dorsopathies, lumbar region: Secondary | ICD-10-CM
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
## 2020-12-26 DIAGNOSIS — R7611 Nonspecific reaction to tuberculin skin test without active tuberculosis: Secondary | ICD-10-CM | POA: Insufficient documentation

## 2020-12-26 DIAGNOSIS — I10 Essential (primary) hypertension: Secondary | ICD-10-CM | POA: Insufficient documentation

## 2020-12-26 DIAGNOSIS — I517 Cardiomegaly: Secondary | ICD-10-CM | POA: Insufficient documentation

## 2020-12-26 DIAGNOSIS — Z86711 Personal history of pulmonary embolism: Secondary | ICD-10-CM | POA: Insufficient documentation

## 2020-12-27 ENCOUNTER — Ambulatory Visit: Payer: 59 | Admitting: Physical Therapy

## 2020-12-27 ENCOUNTER — Other Ambulatory Visit: Payer: Self-pay

## 2020-12-27 DIAGNOSIS — M545 Low back pain, unspecified: Secondary | ICD-10-CM

## 2020-12-27 DIAGNOSIS — M5386 Other specified dorsopathies, lumbar region: Secondary | ICD-10-CM

## 2020-12-27 DIAGNOSIS — M6281 Muscle weakness (generalized): Secondary | ICD-10-CM
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
## 2020-12-28 ENCOUNTER — Encounter: Payer: Self-pay | Admitting: Physical Therapy

## 2020-12-28 DIAGNOSIS — E7800 Pure hypercholesterolemia, unspecified: Secondary | ICD-10-CM | POA: Insufficient documentation

## 2020-12-28 DIAGNOSIS — F1090 Alcohol use, unspecified, uncomplicated: Secondary | ICD-10-CM | POA: Insufficient documentation

## 2021-01-01 ENCOUNTER — Ambulatory Visit: Payer: 59 | Admitting: Physical Therapy

## 2021-01-01 ENCOUNTER — Encounter: Payer: Self-pay | Admitting: Physical Therapy

## 2021-01-01 ENCOUNTER — Other Ambulatory Visit: Payer: Self-pay

## 2021-01-01 DIAGNOSIS — M545 Low back pain, unspecified: Secondary | ICD-10-CM | POA: Diagnosis not present

## 2021-01-01 DIAGNOSIS — M6281 Muscle weakness (generalized): Secondary | ICD-10-CM

## 2021-01-01 DIAGNOSIS — M5386 Other specified dorsopathies, lumbar region: Secondary | ICD-10-CM

## 2021-01-01 DIAGNOSIS — R7303 Prediabetes: Secondary | ICD-10-CM | POA: Insufficient documentation

## 2021-01-01 DIAGNOSIS — E538 Deficiency of other specified B group vitamins: Secondary | ICD-10-CM | POA: Insufficient documentation
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
## 2021-01-03 ENCOUNTER — Encounter: Payer: 59 | Admitting: Physical Therapy

## 2021-01-08 ENCOUNTER — Other Ambulatory Visit: Payer: Self-pay

## 2021-01-08 ENCOUNTER — Encounter: Payer: Self-pay | Admitting: Physical Therapy

## 2021-01-08 ENCOUNTER — Ambulatory Visit: Payer: 59 | Admitting: Physical Therapy

## 2021-01-08 DIAGNOSIS — M545 Low back pain, unspecified: Secondary | ICD-10-CM | POA: Diagnosis not present

## 2021-01-08 DIAGNOSIS — M5386 Other specified dorsopathies, lumbar region: Secondary | ICD-10-CM

## 2021-01-08 DIAGNOSIS — M6281 Muscle weakness (generalized): Secondary | ICD-10-CM
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
## 2021-01-10 ENCOUNTER — Other Ambulatory Visit: Payer: Self-pay

## 2021-01-10 ENCOUNTER — Ambulatory Visit: Payer: 59 | Admitting: Physical Therapy

## 2021-01-10 DIAGNOSIS — M545 Low back pain, unspecified: Secondary | ICD-10-CM | POA: Diagnosis not present

## 2021-01-10 DIAGNOSIS — M6281 Muscle weakness (generalized): Secondary | ICD-10-CM

## 2021-01-10 DIAGNOSIS — M5386 Other specified dorsopathies, lumbar region: Secondary | ICD-10-CM
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
## 2021-01-14 DIAGNOSIS — G8929 Other chronic pain: Secondary | ICD-10-CM | POA: Insufficient documentation

## 2021-01-15 ENCOUNTER — Encounter: Payer: Self-pay | Admitting: Physical Therapy

## 2021-01-15 ENCOUNTER — Other Ambulatory Visit: Payer: Self-pay

## 2021-01-15 ENCOUNTER — Ambulatory Visit: Payer: 59 | Attending: Emergency Medicine | Admitting: Physical Therapy

## 2021-01-15 DIAGNOSIS — M5386 Other specified dorsopathies, lumbar region: Secondary | ICD-10-CM | POA: Diagnosis present

## 2021-01-15 DIAGNOSIS — M545 Low back pain, unspecified: Secondary | ICD-10-CM | POA: Insufficient documentation

## 2021-01-15 DIAGNOSIS — M6281 Muscle weakness (generalized): Secondary | ICD-10-CM | POA: Insufficient documentation
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
## 2021-01-17 ENCOUNTER — Other Ambulatory Visit: Payer: Self-pay

## 2021-01-17 ENCOUNTER — Ambulatory Visit: Payer: 59 | Admitting: Physical Therapy

## 2021-01-17 DIAGNOSIS — M545 Low back pain, unspecified: Secondary | ICD-10-CM

## 2021-01-17 DIAGNOSIS — M5386 Other specified dorsopathies, lumbar region: Secondary | ICD-10-CM

## 2021-01-17 DIAGNOSIS — M6281 Muscle weakness (generalized): Secondary | ICD-10-CM
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
## 2021-01-22 ENCOUNTER — Other Ambulatory Visit: Payer: Self-pay

## 2021-01-22 ENCOUNTER — Ambulatory Visit: Payer: 59 | Admitting: Physical Therapy

## 2021-01-22 DIAGNOSIS — M5386 Other specified dorsopathies, lumbar region: Secondary | ICD-10-CM

## 2021-01-22 DIAGNOSIS — M545 Low back pain, unspecified: Secondary | ICD-10-CM | POA: Diagnosis not present

## 2021-01-22 DIAGNOSIS — M6281 Muscle weakness (generalized): Secondary | ICD-10-CM
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
## 2021-01-24 ENCOUNTER — Ambulatory Visit: Payer: 59 | Admitting: Physical Therapy

## 2021-01-24 ENCOUNTER — Encounter: Payer: Self-pay | Admitting: Physical Therapy

## 2021-01-24 ENCOUNTER — Other Ambulatory Visit: Payer: Self-pay

## 2021-01-24 DIAGNOSIS — M545 Low back pain, unspecified: Secondary | ICD-10-CM | POA: Diagnosis not present

## 2021-01-24 DIAGNOSIS — M5386 Other specified dorsopathies, lumbar region: Secondary | ICD-10-CM

## 2021-01-24 DIAGNOSIS — M6281 Muscle weakness (generalized): Secondary | ICD-10-CM
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
## 2021-01-29 ENCOUNTER — Encounter: Payer: Self-pay | Admitting: Physical Therapy

## 2021-01-29 ENCOUNTER — Ambulatory Visit: Payer: 59 | Admitting: Physical Therapy

## 2021-01-29 ENCOUNTER — Other Ambulatory Visit: Payer: Self-pay

## 2021-01-29 DIAGNOSIS — M545 Low back pain, unspecified: Secondary | ICD-10-CM

## 2021-01-29 DIAGNOSIS — M6281 Muscle weakness (generalized): Secondary | ICD-10-CM

## 2021-01-29 DIAGNOSIS — M5386 Other specified dorsopathies, lumbar region: Secondary | ICD-10-CM
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
## 2021-01-31 ENCOUNTER — Ambulatory Visit: Payer: 59 | Admitting: Physical Therapy

## 2021-01-31 ENCOUNTER — Other Ambulatory Visit: Payer: Self-pay

## 2021-01-31 DIAGNOSIS — M545 Low back pain, unspecified: Secondary | ICD-10-CM | POA: Diagnosis not present

## 2021-01-31 DIAGNOSIS — M5386 Other specified dorsopathies, lumbar region: Secondary | ICD-10-CM

## 2021-01-31 DIAGNOSIS — M6281 Muscle weakness (generalized): Secondary | ICD-10-CM
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
## 2021-02-04 ENCOUNTER — Ambulatory Visit: Payer: 59 | Admitting: Physical Therapy

## 2021-02-05 ENCOUNTER — Other Ambulatory Visit: Payer: Self-pay

## 2021-02-05 ENCOUNTER — Ambulatory Visit: Payer: 59 | Admitting: Physical Therapy

## 2021-02-05 DIAGNOSIS — M5386 Other specified dorsopathies, lumbar region: Secondary | ICD-10-CM

## 2021-02-05 DIAGNOSIS — M6281 Muscle weakness (generalized): Secondary | ICD-10-CM

## 2021-02-05 DIAGNOSIS — M545 Low back pain, unspecified: Secondary | ICD-10-CM
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
## 2021-02-07 ENCOUNTER — Ambulatory Visit: Payer: 59 | Admitting: Physical Therapy

## 2021-02-07 ENCOUNTER — Encounter: Payer: Self-pay | Admitting: Physical Therapy

## 2021-02-07 ENCOUNTER — Other Ambulatory Visit: Payer: Self-pay

## 2021-02-07 DIAGNOSIS — M545 Low back pain, unspecified: Secondary | ICD-10-CM | POA: Diagnosis not present

## 2021-02-07 DIAGNOSIS — M6281 Muscle weakness (generalized): Secondary | ICD-10-CM

## 2021-02-07 DIAGNOSIS — M5386 Other specified dorsopathies, lumbar region: Secondary | ICD-10-CM
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
## 2021-02-09 ENCOUNTER — Encounter: Payer: Self-pay | Admitting: Physical Therapy

## 2021-03-10 ENCOUNTER — Other Ambulatory Visit: Payer: Self-pay | Admitting: Family Medicine

## 2021-06-13 ENCOUNTER — Encounter: Payer: Self-pay | Admitting: *Deleted

## 2021-06-14 ENCOUNTER — Ambulatory Visit
Admission: RE | Admit: 2021-06-14 | Discharge: 2021-06-14 | Disposition: A | Discharge status: Home / Self Care | Payer: 59 | Attending: Gastroenterology | Admitting: Gastroenterology

## 2021-06-14 ENCOUNTER — Ambulatory Visit: Payer: 59 | Admitting: Anesthesiology

## 2021-06-14 ENCOUNTER — Encounter
Admission: RE | Disposition: A | Discharge status: Home / Self Care | Payer: Self-pay | Source: Home / Self Care | Attending: Gastroenterology

## 2021-06-14 DIAGNOSIS — K5730 Diverticulosis of large intestine without perforation or abscess without bleeding: Secondary | ICD-10-CM | POA: Diagnosis not present

## 2021-06-14 DIAGNOSIS — D125 Benign neoplasm of sigmoid colon: Secondary | ICD-10-CM | POA: Insufficient documentation

## 2021-06-14 DIAGNOSIS — Z86711 Personal history of pulmonary embolism: Secondary | ICD-10-CM | POA: Diagnosis not present

## 2021-06-14 DIAGNOSIS — I119 Hypertensive heart disease without heart failure: Secondary | ICD-10-CM | POA: Insufficient documentation

## 2021-06-14 DIAGNOSIS — Z1211 Encounter for screening for malignant neoplasm of colon: Secondary | ICD-10-CM | POA: Diagnosis present

## 2021-06-14 DIAGNOSIS — K640 First degree hemorrhoids: Secondary | ICD-10-CM | POA: Insufficient documentation

## 2021-06-14 HISTORY — PX: COLONOSCOPY WITH PROPOFOL: SHX5780

## 2021-06-14 HISTORY — DX: Nonspecific reaction to tuberculin skin test without active tuberculosis: R76.11

## 2021-06-14 HISTORY — DX: Personal history of pulmonary embolism: Z86.711

## 2021-06-14 HISTORY — DX: Cardiomegaly: I51.7

## 2021-06-14 SURGERY — COLONOSCOPY WITH PROPOFOL
Anesthesia: General

## 2021-06-14 MED ORDER — PROPOFOL 500 MG/50ML IV EMUL
INTRAVENOUS | Status: AC
  Filled 2021-06-14: qty 50

## 2021-06-14 MED ORDER — SODIUM CHLORIDE 0.9 % IV SOLN
INTRAVENOUS | Status: DC
  Administered 2021-06-14: 07:00:00 20 mL/h via INTRAVENOUS

## 2021-06-14 MED ORDER — PROPOFOL 500 MG/50ML IV EMUL
INTRAVENOUS | Status: DC | PRN
  Administered 2021-06-14: 200 ug/kg/min via INTRAVENOUS

## 2021-06-14 MED ORDER — PROPOFOL 10 MG/ML IV BOLUS
INTRAVENOUS | Status: DC | PRN
  Administered 2021-06-14: 100 mg via INTRAVENOUS

## 2021-06-14 MED ORDER — PHENYLEPHRINE HCL (PRESSORS) 10 MG/ML IV SOLN
INTRAVENOUS | Status: AC
  Filled 2021-06-14: qty 1

## 2021-06-14 NOTE — H&P (Signed)
Outpatient short stay form Pre-procedure 06/14/2021  [name], MD  Primary Physician: Latanya Maudlin, NP  Reason for visit:  Screening  History of present illness:   55 y/o gentleman with history of hypertension here for screening colonoscopy. No blood thinners. No abdominal surgeries. No family history of GI malignancies. No new symptoms.    Current Facility-Administered Medications:    0.9 %  sodium chloride infusion, , Intravenous, Continuous, Nagee Goates, Hilton Cork, MD, Last Rate: 20 mL/hr at 06/14/21 0658, 20 mL/hr at 06/14/21 0658  Medications Prior to Admission  Medication Sig Dispense Refill Last Dose   aspirin EC 81 MG tablet Take 81 mg by mouth daily. Swallow whole.   Past Week   atenolol (TENORMIN) 25 MG tablet Take 1 tablet (25 mg total) by mouth daily. 90 tablet 2 06/13/2021   atorvastatin (LIPITOR) 10 MG tablet Take 1 tablet (10 mg total) by mouth daily. 90 tablet 0 06/13/2021   hydrochlorothiazide (HYDRODIURIL) 25 MG tablet Take 1 tablet (25 mg total) by mouth daily. 90 tablet 0 06/13/2021   ketorolac (TORADOL) 10 MG tablet Take 1 tablet (10 mg total) by mouth every 6 (six) hours as needed. 20 tablet 0 Past Week   lidocaine (LIDODERM) 5 % Place 1 patch onto the skin every 12 (twelve) hours. Remove & Discard patch within 12 hours or as directed by MD 10 patch 0 Past Week   orphenadrine (NORFLEX) 100 MG tablet Take 1 tablet (100 mg total) by mouth 2 (two) times daily. 60 tablet 1 Past Week   oxyCODONE-acetaminophen (PERCOCET) 7.5-325 MG tablet Take 1 tablet by mouth every 6 (six) hours as needed for severe pain. 12 tablet 0 Past Week   atenolol (TENORMIN) 25 MG tablet Take 1 tablet (25 mg total) by mouth daily. 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    Physical Exam  Gen: Alert, oriented. Appears stated age.  HEENT: PERRLA. Lungs: No respiratory distress CV: RRR Abd: soft, benign, no masses Ext: No edema    Planned procedures: Proceed with colonoscopy. The patient understands the nature of the planned procedure, indications, risks, alternatives and potential complications including but not limited to bleeding, infection, perforation, damage to internal organs and possible oversedation/side effects from anesthesia. The patient agrees and gives consent to proceed.  Please refer to procedure notes for findings, recommendations and patient disposition/instructions.     [name], MD [facility]

## 2021-06-14 NOTE — Transfer of Care (Signed)
Immediate Anesthesia Transfer of Care Note  Patient: Tony Graves  Procedure(s) Performed: COLONOSCOPY WITH PROPOFOL  Patient Location: PACU  Anesthesia Type:General  Level of Consciousness: sedated  Airway & Oxygen Therapy: Patient Spontanous Breathing and Patient connected to nasal cannula oxygen  Post-op Assessment: Report given to RN and Post -op Vital signs reviewed and stable  Post vital signs: Reviewed and stable  Last Vitals:  Vitals Value Taken Time  BP 109/82 06/14/21 0813  Temp 36 C 06/14/21 0810  Pulse 73 06/14/21 0813  Resp 16 06/14/21 0813  SpO2 100 % 06/14/21 0813  Vitals shown include unvalidated device data.  Last Pain:  Vitals:   06/14/21 0810  TempSrc: Temporal  PainSc:          Complications: No notable events documented.

## 2021-06-14 NOTE — Anesthesia Postprocedure Evaluation (Signed)
Anesthesia Post Note  Patient: Tony Graves  Procedure(s) Performed: COLONOSCOPY WITH PROPOFOL  Patient location during evaluation: Endoscopy Anesthesia Type: General Level of consciousness: awake and alert Pain management: pain level controlled Vital Signs Assessment: post-procedure vital signs reviewed and stable Respiratory status: spontaneous breathing, nonlabored ventilation and respiratory function stable Cardiovascular status: blood pressure returned to baseline and stable Postop Assessment: no apparent nausea or vomiting Anesthetic complications: no   No notable events documented.   Last Vitals:  Vitals:   06/14/21 0820 06/14/21 0840  BP: 109/82 (!) 151/97  Pulse: 74 60  Resp: 16   Temp:    SpO2: 100% 100%    Last Pain:  Vitals:   06/14/21 0810  TempSrc: Temporal  PainSc:                  [place] Ouch

## 2021-06-14 NOTE — Op Note (Signed)
[facility] Patient Name: Tony Graves Procedure Date: 06/14/2021 7:16 AM MRN: [id number] Account #: [id number] Date of Birth: 03/31/1966 Admit Type: Outpatient Age: 55 Room: [facility] ENDO ROOM 3 Gender: Male Note Status: Finalized Instrument Name: Park Meo [id number] Procedure:             Colonoscopy Indications:           Screening for colorectal malignant neoplasm Providers:             Andrey Farmer MD, MD Referring MD:          Larene Beach coward Medicines:             Monitored Anesthesia Care Complications:         No immediate complications. Estimated blood loss:                         Minimal. Procedure:             Pre-Anesthesia Assessment:                        - Prior to the procedure, a History and Physical was                         performed, and patient medications and allergies were                         reviewed. The patient is competent. The risks and                         benefits of the procedure and the sedation options and                         risks were discussed with the patient. All questions                         were answered and informed consent was obtained.                         Patient identification and proposed procedure were                         verified by the physician, the nurse, the                         anesthesiologist, the anesthetist and the technician                         in the endoscopy suite. Mental Status Examination:                         alert and oriented. Airway Examination: normal                         oropharyngeal airway and neck mobility. Respiratory                         Examination: clear to auscultation. CV Examination:  normal. Prophylactic Antibiotics: The patient does not                         require prophylactic antibiotics. Prior                         Anticoagulants: The patient has taken no previous                          anticoagulant or antiplatelet agents. ASA Grade                         Assessment: II - A patient with mild systemic disease.                         After reviewing the risks and benefits, the patient                         was deemed in satisfactory condition to undergo the                         procedure. The anesthesia plan was to use monitored                         anesthesia care (MAC). Immediately prior to                         administration of medications, the patient was                         re-assessed for adequacy to receive sedatives. The                         heart rate, respiratory rate, oxygen saturations,                         blood pressure, adequacy of pulmonary ventilation, and                         response to care were monitored throughout the                         procedure. The physical status of the patient was                         re-assessed after the procedure.                        After obtaining informed consent, the colonoscope was                         passed under direct vision. Throughout the procedure,                         the patient's blood pressure, pulse, and oxygen                         saturations were monitored continuously. The  Colonoscope was introduced through the anus and                         advanced to the the cecum, identified by appendiceal                         orifice and ileocecal valve. The colonoscopy was                         performed without difficulty. The patient tolerated                         the procedure well. The quality of the bowel                         preparation was good. Findings:      The perianal and digital rectal examinations were normal.      A 12 mm polyp was found in the sigmoid colon. The polyp was       pedunculated. The polyp was removed with a hot snare. Resection and       retrieval were complete. To prevent bleeding post-intervention, one        hemostatic clip was successfully placed. There was no bleeding during,       or at the end, of the procedure.      Multiple small-mouthed diverticula were found in the sigmoid colon,       descending colon, transverse colon and ascending colon.      Internal hemorrhoids were found during retroflexion. The hemorrhoids       were Grade I (internal hemorrhoids that do not prolapse).      The exam was otherwise without abnormality on direct and retroflexion       views. Impression:            - One 12 mm polyp in the sigmoid colon, removed with a                         hot snare. Resected and retrieved. Clip was placed.                        - Diverticulosis in the sigmoid colon, in the                         descending colon, in the transverse colon and in the                         ascending colon.                        - Internal hemorrhoids.                        - The examination was otherwise normal on direct and                         retroflexion views. Recommendation:        - Discharge patient to home.                        - Resume previous diet.                        -  Continue present medications.                        - Await pathology results.                        - Repeat colonoscopy for surveillance based on                         pathology results.                        - Return to referring physician as previously                         scheduled. Procedure Code(s):     --- Professional ---                        [phone], Colonoscopy, flexible; with removal of                         tumor(s), polyp(s), or other lesion(s) by snare                         technique Diagnosis Code(s):     --- Professional ---                        Z12.11, Encounter for screening for malignant neoplasm                         of colon                        K63.5, Polyp of colon                        K64.0, First degree hemorrhoids                        K57.30,  Diverticulosis of large intestine without                         perforation or abscess without bleeding CPT copyright 2019 [employer]. All rights reserved. The codes documented in this report are preliminary and upon coder review may  be revised to meet current compliance requirements. Andrey Farmer MD, MD 06/14/2021 8:14:44 AM Number of Addenda: 0 Note Initiated On: 06/14/2021 7:16 AM Scope Withdrawal Time: 0 hours 14 minutes 10 seconds  Total Procedure Duration: 0 hours 18 minutes 25 seconds  Estimated Blood Loss:  Estimated blood loss was minimal.      [facility]

## 2021-06-14 NOTE — Anesthesia Preprocedure Evaluation (Addendum)
Anesthesia Evaluation  Patient identified by MRN, date of birth, ID band Patient awake    Reviewed: Allergy & Precautions, NPO status , Patient's Chart, lab work & pertinent test results, reviewed documented beta blocker date and time   Airway Mallampati: II  TM Distance: >3 FB Neck ROM: Full    Dental no notable dental hx.    Pulmonary Current Smoker and Patient abstained from smoking.,    Pulmonary exam normal breath sounds clear to auscultation       Cardiovascular Exercise Tolerance: Good hypertension, Pt. on home beta blockers Normal cardiovascular exam Rhythm:Regular Rate:Normal  Mild cardiomegaly History of pulmonary embolism   Neuro/Psych negative neurological ROS  negative psych ROS   GI/Hepatic negative GI ROS, Neg liver ROS,   Endo/Other  negative endocrine ROS  Renal/GU negative Renal ROS  negative genitourinary   Musculoskeletal negative musculoskeletal ROS (+)   Abdominal Normal abdominal exam  (+)   Peds negative pediatric ROS (+)  Hematology negative hematology ROS (+)   Anesthesia Other Findings   Reproductive/Obstetrics negative OB ROS                            Anesthesia Physical Anesthesia Plan  ASA: 2  Anesthesia Plan: General   Post-op Pain Management:    Induction: Intravenous  PONV Risk Score and Plan:   Airway Management Planned: Natural Airway and Nasal Cannula  Additional Equipment:   Intra-op Plan:   Post-operative Plan:   Informed Consent: I have reviewed the patients History and Physical, chart, labs and discussed the procedure including the risks, benefits and alternatives for the proposed anesthesia with the patient or authorized representative who has indicated his/her understanding and acceptance.     Dental Advisory Given  Plan Discussed with: Anesthesiologist, CRNA and Surgeon  Anesthesia Plan Comments:         Anesthesia  Quick Evaluation

## 2021-06-14 NOTE — Interval H&P Note (Signed)
History and Physical Interval Note:  06/14/2021 7:44 AM  Tony Graves  has presented today for surgery, with the diagnosis of colon cancer screening.  The various methods of treatment have been discussed with the patient and family. After consideration of risks, benefits and other options for treatment, the patient has consented to  Procedure(s): COLONOSCOPY WITH PROPOFOL (N/A) as a surgical intervention.  The patient's history has been reviewed, patient examined, no change in status, stable for surgery.  I have reviewed the patient's chart and labs.  Questions were answered to the patient's satisfaction.     [name]  Ok to proceed with colonoscopy

## 2021-06-18 ENCOUNTER — Encounter: Payer: Self-pay | Admitting: Gastroenterology

## 2021-06-18 LAB — SURGICAL PATHOLOGY

## 2021-09-16 ENCOUNTER — Other Ambulatory Visit: Payer: Self-pay | Admitting: Gerontology

## 2021-09-16 DIAGNOSIS — R7611 Nonspecific reaction to tuberculin skin test without active tuberculosis: Secondary | ICD-10-CM

## 2021-09-18 ENCOUNTER — Ambulatory Visit
Admission: RE | Admit: 2021-09-18 | Discharge: 2021-09-18 | Disposition: A | Discharge status: Home / Self Care | Payer: Self-pay | Source: Ambulatory Visit | Attending: Gerontology | Admitting: Gerontology

## 2021-09-18 DIAGNOSIS — R7611 Nonspecific reaction to tuberculin skin test without active tuberculosis: Secondary | ICD-10-CM | POA: Insufficient documentation

## 2021-09-19 DIAGNOSIS — I7121 Aneurysm of the ascending aorta, without rupture: Secondary | ICD-10-CM | POA: Insufficient documentation

## 2021-10-03 ENCOUNTER — Telehealth: Payer: Self-pay | Admitting: Surgery

## 2021-10-03 DIAGNOSIS — R7612 Nonspecific reaction to cell mediated immunity measurement of gamma interferon antigen response without active tuberculosis: Secondary | ICD-10-CM

## 2021-10-03 NOTE — Telephone Encounter (Signed)
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  ? ?Verbally confirms he had tx, or at least he thinks he had treatment since it was such a long time ago. He is going to be back in his home town tomorrow and will be getting copies of his records. ? ?He requested a fax number where he could send them, gave him coordinators room [phone]. ? ?Will follow up with pt on Tuesday next week. ? ?Leigh Aurora, MD ? ?

## 2021-11-08 ENCOUNTER — Telehealth: Payer: Self-pay | Admitting: Surgery

## 2021-11-08 DIAGNOSIS — R7612 Nonspecific reaction to cell mediated immunity measurement of gamma interferon antigen response without active tuberculosis: Secondary | ICD-10-CM

## 2021-11-08 NOTE — Telephone Encounter (Signed)
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 But reports he just talked to health care provider in his home town and he should have the treatment records by Tuesday.   He reports he still has our fax number at [facility] from our last phone call, he will send them to [place].  Told him that if he has any trouble obtaining them, let me know and we can reach out to other facilities for records.  Will look for fax on Tuesday 5/30.  Leigh Aurora, MD   Patient came in and signed ROI to send to [facility] on 11/15/2021.  Faxed ROI to TB Nurse Luellen Pucker at [phone]  Fax successful.  Leigh Aurora, MD

## 2021-11-12 ENCOUNTER — Encounter (INDEPENDENT_AMBULATORY_CARE_PROVIDER_SITE_OTHER): Payer: Self-pay | Admitting: Vascular Surgery

## 2021-11-19 ENCOUNTER — Encounter (INDEPENDENT_AMBULATORY_CARE_PROVIDER_SITE_OTHER): Payer: Self-pay | Admitting: Vascular Surgery

## 2021-11-19 ENCOUNTER — Ambulatory Visit (INDEPENDENT_AMBULATORY_CARE_PROVIDER_SITE_OTHER): Payer: Self-pay | Admitting: Vascular Surgery

## 2021-11-19 VITALS — BP 143/89 | HR 76 | Resp 16 | Wt 178.0 lb

## 2021-11-19 DIAGNOSIS — E7800 Pure hypercholesterolemia, unspecified: Secondary | ICD-10-CM

## 2021-11-19 DIAGNOSIS — I10 Essential (primary) hypertension: Secondary | ICD-10-CM

## 2021-11-19 DIAGNOSIS — I7121 Aneurysm of the ascending aorta, without rupture: Secondary | ICD-10-CM

## 2021-11-19 NOTE — Assessment & Plan Note (Signed)
lipid control important in reducing the progression of atherosclerotic disease. Continue statin therapy  

## 2021-11-19 NOTE — Assessment & Plan Note (Signed)
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  This does not pose any immediate risk to him, but it is something we will need to monitor over time.  At this size, it could be rechecked in 2 years with CT scan.  Blood pressure control is by far the most important factor in reducing growth and tobacco avoidance is also important.

## 2021-11-19 NOTE — Patient Instructions (Signed)
Thoracic Aortic Aneurysm  An aneurysm is a bulge in an artery. It happens when blood pushes up against a weakened or damaged artery wall. A thoracic aortic aneurysm is an aneurysm that occurs in the first part of the aorta, between the heart and the diaphragm. The aorta is the main artery of the body. It supplies blood from the heart to the rest of the body. Some aneurysms may not cause symptoms or problems. However, a thoracic aortic aneurysm can cause two serious problems: It can enlarge and burst (rupture). It can cause blood to flow between the layers of the wall of the aorta through a tear (aortic dissection). Both of these problems are medical emergencies. They can cause bleeding inside the body and can be life-threatening if they are not diagnosed and treated right away. What are the causes? The exact cause of this condition is not known. What increases the risk? The following factors may make you more likely to develop this condition: Being 65 years of age or older. Having a family history of aneurysms. Using tobacco. Having any of these conditions: Hardening of the arteries caused by the buildup of fat and other substances in the lining of a blood vessel (arteriosclerosis). Inflammation of the walls of an artery (arteritis). A genetic disease that weakens the body's connective tissue, such as Marfan syndrome. An injury or trauma to the aorta. High blood pressure (hypertension). High cholesterol. An infection from bacteria, such as syphilis or staphylococcus, in the wall of the aorta (infectious aortitis). What are the signs or symptoms? Symptoms of this condition vary depending on the size of the aneurysm and how fast it is growing. Most grow slowly and do not cause symptoms. When symptoms do occur, they may include: Pain in the chest, back, sides, or abdomen. The pain may vary in intensity. Sudden, severe pain may indicate that the aneurysm has  ruptured. Hoarseness. Cough. Shortness of breath. Swallowing problems. Swelling in the face, arms, or legs. Fever. Unexplained weight loss. How is this diagnosed? This condition may be diagnosed with: An ultrasound. X-rays. CT scan. MRI. A test to check the arteries for damage or blockages (angiogram). Most unruptured thoracic aortic aneurysms cause no symptoms, so they are often found during exams for other conditions. How is this treated? Treatment for this condition depends on: The size of the aneurysm. How fast the aneurysm is growing. Your age. Risk factors for rupture. Small aneurysms (2.2 inches, or 5.5 cm, or less) may be managed with: Medicines to: Control blood pressure. Manage pain. Fight infection. Regular monitoring. This may include an ultrasound or CT scan every year or every 6 months to see if the aneurysm is getting bigger. Large or fast-growing aneurysms may be treated with surgery. Follow these instructions at home: Eating and drinking  Eat a healthy diet. Your health care provider may recommend that you: Lower your salt (sodium) intake. In some people, too much salt can raise blood pressure and increase the risk for thoracic aortic aneurysm. Avoid foods that are high in saturated fat and cholesterol, such as red meat and full-fat dairy. Eat a diet that is low in sugar. Increase your fiber intake by including whole grains, vegetables, and fruits in your diet. Eating these foods may help to lower your blood pressure. Do not drink alcohol if your health care provider tells you not to drink. If you drink alcohol: Limit how much you use to: 0-1 drink a day for women who are not pregnant. 0-2 drinks a day   for men. Be aware of how much alcohol is in your drink. In the [place] one drink equals one 12 oz bottle of beer (355 mL), one 5 oz glass of wine (148 mL), or one 1 oz glass of hard liquor (44 mL). Lifestyle Do not use any products that contain nicotine or  tobacco, such as cigarettes, e-cigarettes, and chewing tobacco. If you need help quitting, ask your health care provider. Maintain a healthy weight. Check your blood pressure regularly. Follow your health care provider's instructions on how to keep your blood pressure within normal limits. Have your blood sugar (glucose) level and cholesterol levels checked regularly. Follow your health care provider's instructions on how to keep levels within normal limits. Activity  Stay physically active and exercise regularly. Talk with your health care provider about how often you should exercise and ask which types of exercise are best for you. Avoid heavy lifting and activities that take a lot of effort. Ask your health care provider what activities are safe for you. General instructions Take over-the-counter and prescription medicines only as told by your health care provider. Talk with your health care provider about regular screenings to see if the aneurysm is getting bigger. Keep all follow-up visits as told by your health care provider. This is important. Contact a health care provider if you have: Unexplained weight loss. Get help right away if you have: Pain in your upper back, neck, or abdomen. This pain may move into your chest and arms. Trouble swallowing. A cough or hoarseness. Shortness of breath. Summary A thoracic aortic aneurysm is an aneurysm that occurs in the first part of the aorta, between the heart and the diaphragm. As a thoracic aortic aneurysm becomes larger, it can burst (rupture), or blood can flow between the layers of the wall of the aorta through a tear (aorticdissection). These conditions can be life-threatening if they are not diagnosed and treated right away. If you have a thoracic aortic aneurysm, its growth will be closely monitored. Surgical repair may be needed for larger or faster-growing aneurysms. This information is not intended to replace advice given to you by  your health care provider. Make sure you discuss any questions you have with your health care provider. Document Revised: 08/13/2020 Document Reviewed: 08/13/2020 Elsevier Patient Education  2023 Elsevier Inc.  

## 2021-11-19 NOTE — Assessment & Plan Note (Signed)
blood pressure control important in reducing the progression of atherosclerotic disease and aneurysmal growth. On appropriate oral medications.  If blood pressure control becomes a worsening issue, consider renal artery duplex in the future

## 2021-11-19 NOTE — Progress Notes (Signed)
Patient ID: Tony Graves, male   DOB: 04-30-1966, 56 y.o.   MRN: [id number]  Chief Complaint  Patient presents with   New Patient (Initial Visit)    Ref Feldpausch consult ectatic ascending thoracic aorta measuring 4 cm    HPI Tony Graves is a 56 y.o. male.  I am asked to see the patient by Dr. Ellison Hughs for evaluation of an ascending thoracic aortic aneurysm.  The patient has longstanding hypertension and is on multiple medications.  He has a significant history of heart attack, stroke, and other vascular issues in his family.  He has undergone a CT scan of the chest which I have independently reviewed.  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  For this reason, he is referred for further evaluation and treatment.  No current chest pain, severe back pain, or signs of peripheral embolization.   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     Past Surgical History:  Procedure Laterality Date   COLONOSCOPY WITH PROPOFOL N/A 06/14/2021   Procedure: COLONOSCOPY WITH PROPOFOL;  Surgeon: [name], MD;  Location: [facility] ENDOSCOPY;  Service: Endoscopy;  Laterality: N/A;     Family History  Problem Relation Age of Onset   Hypertension Mother    Stroke Mother    Cancer Father      Social History   Tobacco Use   Smoking status: Every Day    Packs/day: 0.50    Types: Cigarettes   Smokeless tobacco: Never  Vaping Use   Vaping Use: Never used  Substance Use Topics   Alcohol use: Yes    Comment: social   Drug use: Never     Allergies  Allergen Reactions   Lisinopril Swelling   Penicillins     Current Outpatient Medications  Medication Sig Dispense Refill   aspirin EC 81 MG tablet Take 81 mg by mouth daily. Swallow whole.      atenolol (TENORMIN) 25 MG tablet Take 1 tablet (25 mg total) by mouth daily. 90 tablet 2   atorvastatin (LIPITOR) 10 MG tablet Take 1 tablet (10 mg total) by mouth daily. 90 tablet 0   hydrochlorothiazide (HYDRODIURIL) 25 MG tablet Take 1 tablet (25 mg total) by mouth daily. 90 tablet 0   atenolol (TENORMIN) 25 MG tablet Take 1 tablet (25 mg total) by mouth daily. (Patient not taking: Reported on 06/13/2021) 90 tablet 0   ketorolac (TORADOL) 10 MG tablet Take 1 tablet (10 mg total) by mouth every 6 (six) hours as needed. (Patient not taking: Reported on 11/19/2021) 20 tablet 0   lidocaine (LIDODERM) 5 % Place 1 patch onto the skin every 12 (twelve) hours. Remove & Discard patch within 12 hours or as directed by MD (Patient not taking: Reported on 11/19/2021) 10 patch 0   orphenadrine (NORFLEX) 100 MG tablet Take 1 tablet (100 mg total) by mouth 2 (two) times daily. (Patient not taking: Reported on 11/19/2021) 60 tablet 1   oxyCODONE-acetaminophen (PERCOCET) 7.5-325 MG tablet Take 1 tablet by mouth every 6 (six) hours as needed for severe pain. (Patient not taking: Reported on 11/19/2021) 12 tablet 0   No current facility-administered medications for this visit.      REVIEW OF SYSTEMS (Negative unless  checked)  Constitutional: '[]'$ Weight loss  '[]'$ Fever  '[]'$ Chills Cardiac: '[]'$ Chest pain   '[]'$ Chest pressure   '[]'$ Palpitations   '[]'$ Shortness of breath when laying flat   '[]'$ Shortness of breath at rest   '[]'$ Shortness of breath with exertion. Vascular:  '[]'$ Pain in legs with walking   '[]'$ Pain in legs at rest   '[]'$ Pain in legs when laying flat   '[]'$ Claudication   '[]'$ Pain in feet when walking  '[]'$ Pain in feet at rest  '[]'$ Pain in feet when laying flat   '[]'$ History of DVT   '[x]'$ Phlebitis   '[]'$ Swelling in legs   '[]'$ Varicose veins   '[]'$ Non-healing ulcers Pulmonary:   '[]'$ Uses home oxygen   '[]'$ Productive cough   '[]'$ Hemoptysis   '[]'$ Wheeze  '[]'$ COPD   '[]'$ Asthma Neurologic:  '[]'$ Dizziness  '[]'$ Blackouts   '[]'$ Seizures   '[]'$ History of stroke   '[]'$ History of TIA   '[]'$ Aphasia   '[]'$ Temporary blindness   '[]'$ Dysphagia   '[]'$ Weakness or numbness in arms   '[]'$ Weakness or numbness in legs Musculoskeletal:  '[]'$ Arthritis   '[]'$ Joint swelling   '[]'$ Joint pain   '[]'$ Low back pain Hematologic:  '[]'$ Easy bruising  '[]'$ Easy bleeding   '[]'$ Hypercoagulable state   '[]'$ Anemic  '[]'$ Hepatitis Gastrointestinal:  '[]'$ Blood in stool   '[]'$ Vomiting blood  '[]'$ Gastroesophageal reflux/heartburn   '[]'$ Abdominal pain Genitourinary:  '[]'$ Chronic kidney disease   '[]'$ Difficult urination  '[]'$ Frequent urination  '[]'$ Burning with urination   '[]'$ Hematuria Skin:  '[]'$ Rashes   '[]'$ Ulcers   '[]'$ Wounds Psychological:  '[]'$ History of anxiety   '[]'$  History of major depression.    Physical Exam BP (!) 143/89 (BP Location: Left Arm)   Pulse 76   Resp 16   Wt 178 lb (80.7 kg)   BMI 24.83 kg/m  Gen:  WD/WN, NAD. Appears younger than stated age. Head: [place]/AT, No temporalis wasting.  Ear/Nose/Throat: Hearing grossly intact, nares w/o erythema or drainage, oropharynx w/o Erythema/Exudate Eyes: Conjunctiva clear, sclera non-icteric  Neck: trachea midline.  No JVD.  Pulmonary:  Good air movement, respirations not labored, no use of accessory muscles  Cardiac: RRR, no JVD Vascular:  Vessel Right Left  Radial Palpable Palpable                                   Gastrointestinal:. No masses, surgical incisions, or scars. Musculoskeletal: M/S 5/5 throughout.  Extremities without ischemic changes.  No deformity or atrophy. No edema. Neurologic: Sensation grossly intact in extremities.  Symmetrical.  Speech is fluent. Motor exam as listed above. Psychiatric: Judgment intact, Mood & affect appropriate for pt's clinical situation. Dermatologic: No rashes or ulcers noted.  No cellulitis or open wounds.    Radiology No results found.  Labs No results found for this or any previous visit (from the past 2160 hour(s)).  Assessment/Plan:  Aneurysm of ascending aorta without rupture ([place]) I have independently reviewed his CT scan of  the chest and he has approximately 4.0 cm ascending thoracic aorta which is ectatic to mildly aneurysmal.  This does not pose any immediate risk to him, but it is something we will need to monitor over time.  At this size, it could be rechecked in 2 years with CT scan.  Blood pressure control is by far the most important factor in reducing growth and tobacco avoidance is also important.  Essential hypertension blood pressure control important in reducing the progression of atherosclerotic disease and aneurysmal growth. On appropriate oral medications.  If blood pressure control becomes a worsening issue, consider  renal artery duplex in the future   Hypercholesterolemia lipid control important in reducing the progression of atherosclerotic disease. Continue statin therapy      Leotis Pain 11/19/2021, 10:47 AM   This note was created with Dragon medical transcription system.  Any errors from dictation are unintentional.

## 2022-04-14 ENCOUNTER — Encounter (INDEPENDENT_AMBULATORY_CARE_PROVIDER_SITE_OTHER): Payer: Self-pay

## 2022-05-27 IMAGING — CT CT HEAD W/O CM
3 series · 16 of 47 positions shown, 19 images · non-contrast
Comparison: None.

CLINICAL DATA: Poly trauma.

EXAM:
CT HEAD WITHOUT CONTRAST
TECHNIQUE: Contiguous axial images were obtained from the base of the skull
through the vertex without intravenous contrast.

[Series 2: head wo · axial · 0.44mm/px · z∈[+260,+395]mm · 10 of 33 slices shown, 13 images]
[im 3/33  brain]
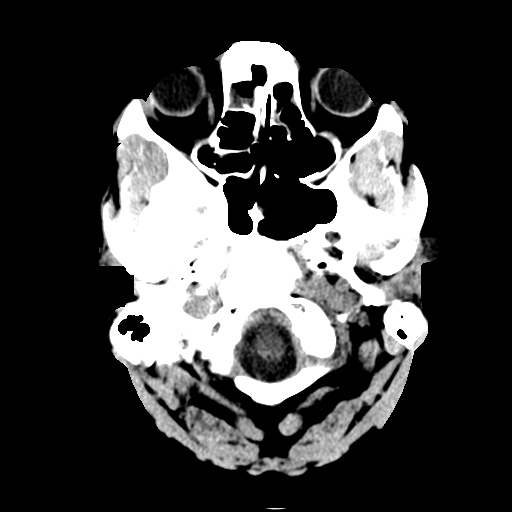
[im 3/33  bone]
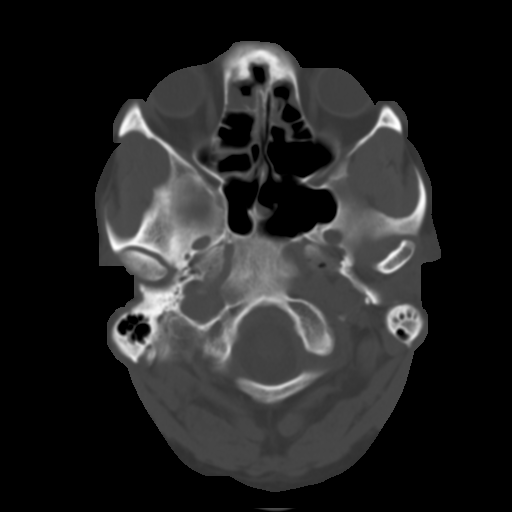
[im 6/33  brain]
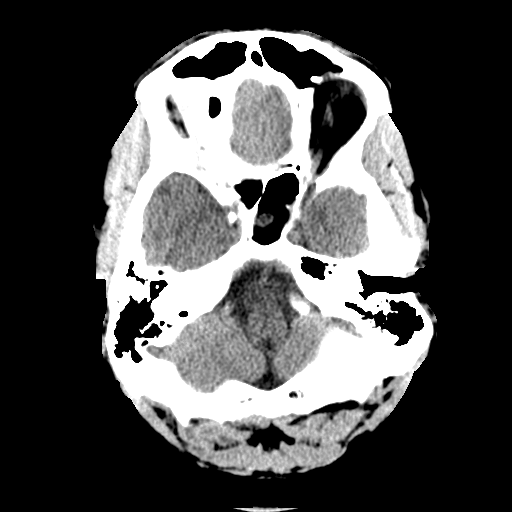
[im 9/33  brain]
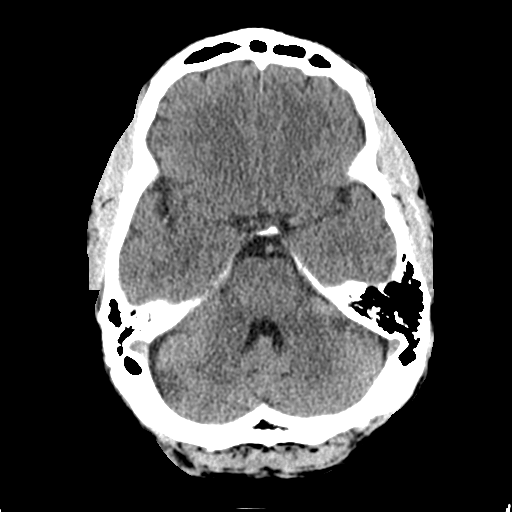
[im 12/33  brain]
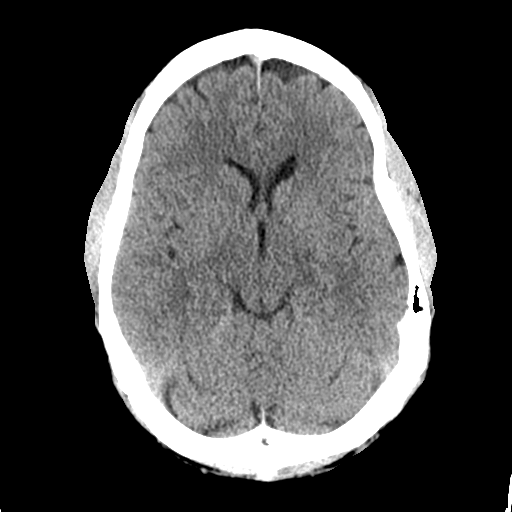
[im 15/33  brain]
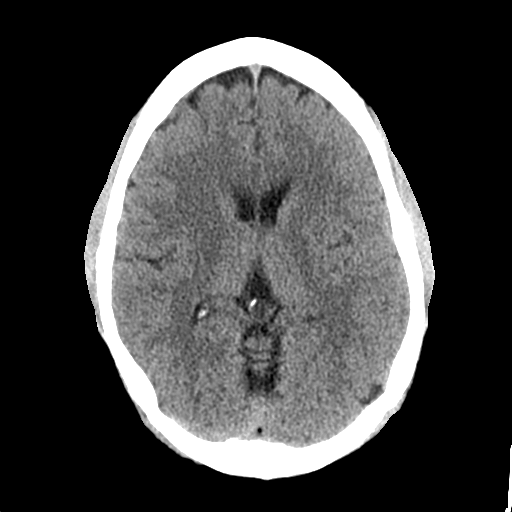
[im 15/33  bone]
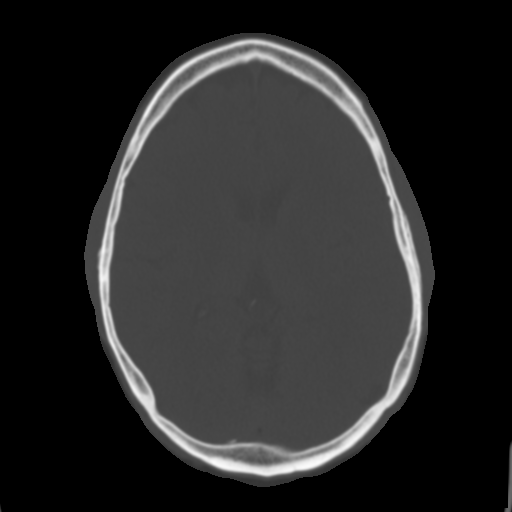
[im 18/33  brain]
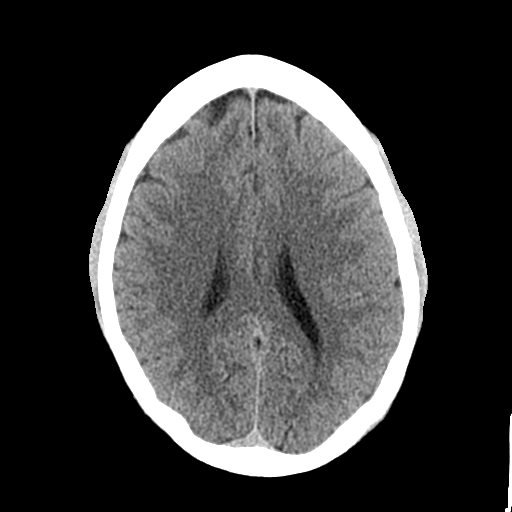
[im 21/33  brain]
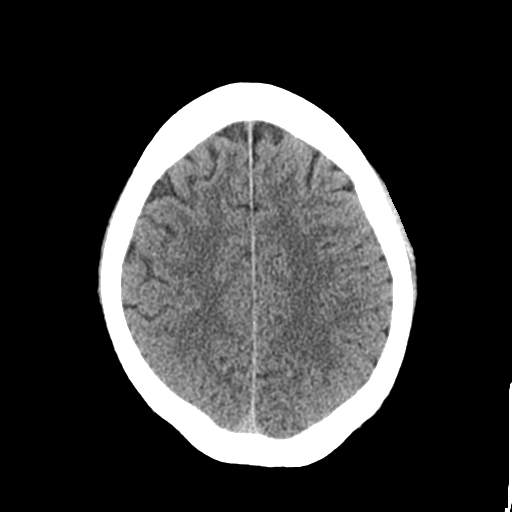
[im 25/33  brain]
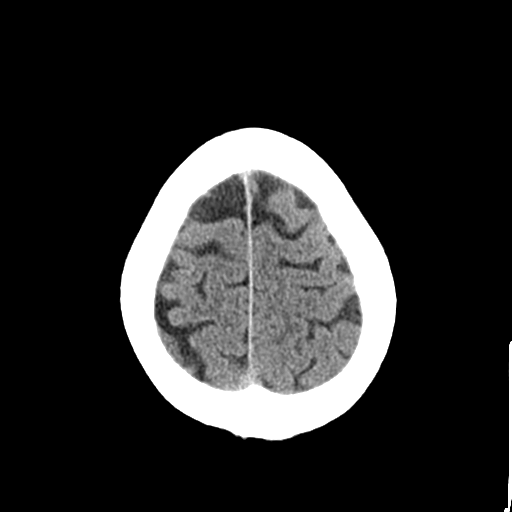
[im 27/33  brain]
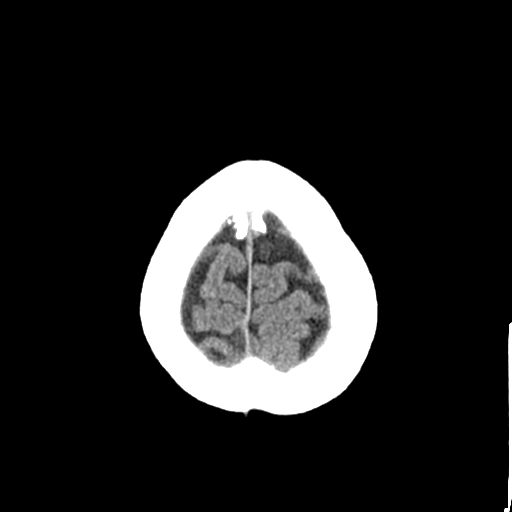
[im 27/33  bone]
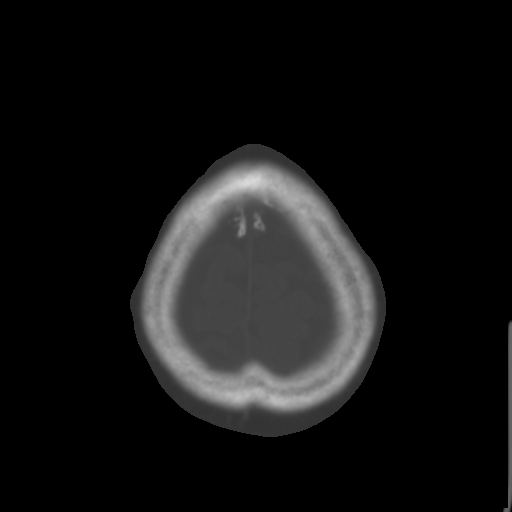
[im 30/33  brain]
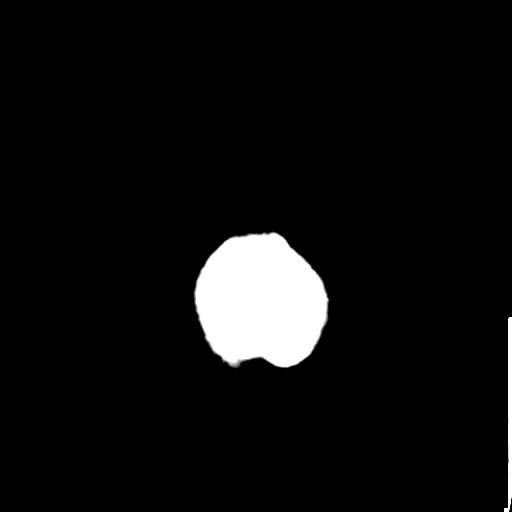

[Series 4: coronal soft tissue · coronal · 0.34mm/px · 3 of 71 slices shown]
[im 24/71  brain]
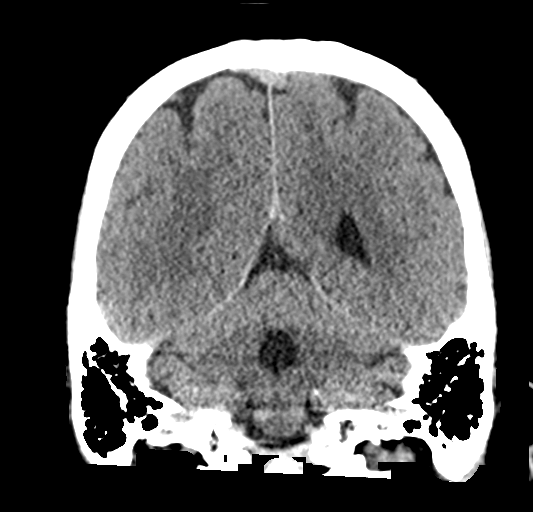
[im 32/71  brain]
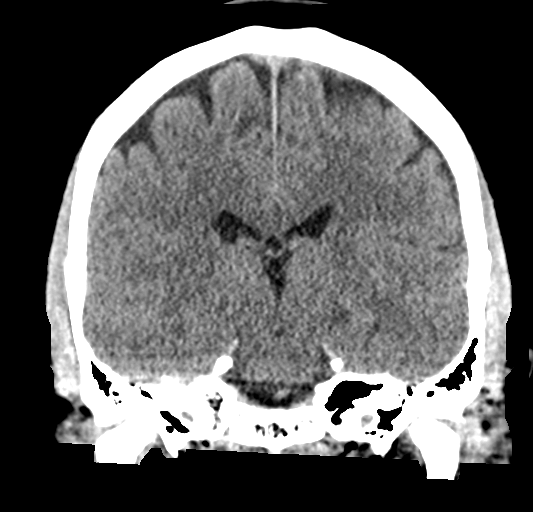
[im 39/71  brain]
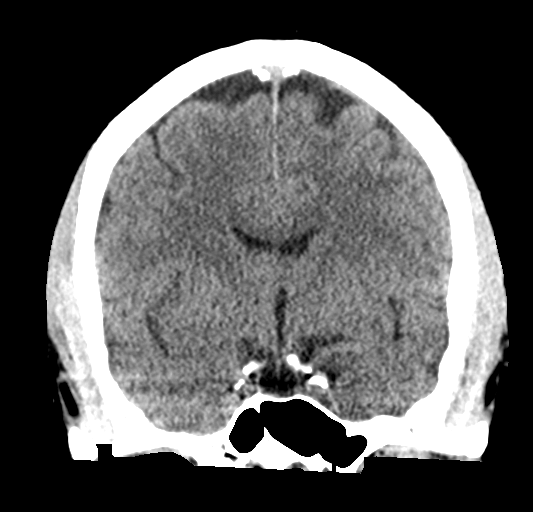

[Series 5: sagittal soft tissue · sagittal · 0.35mm/px · 3 of 63 slices shown]
[im 21/63  brain]
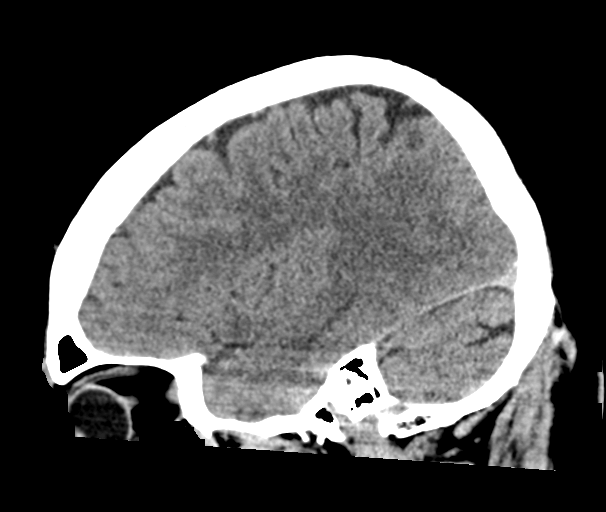
[im 32/63  brain]
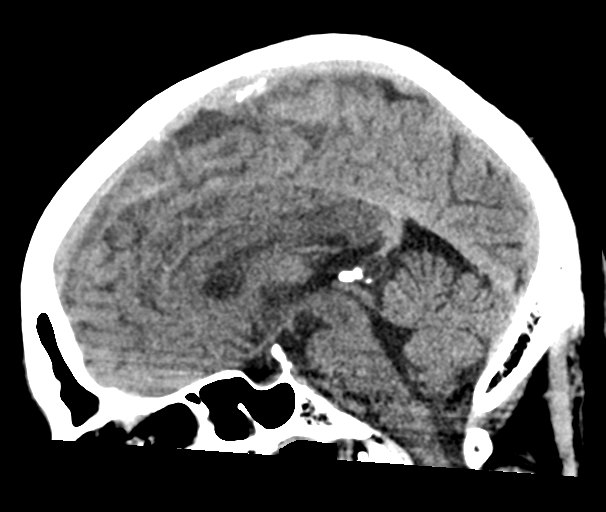
[im 42/63  brain]
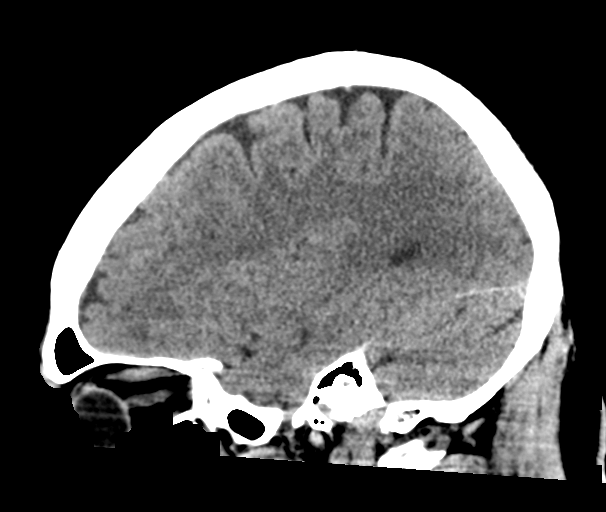

[16 of 47 positions shown; findings below may reference images not displayed]

FINDINGS: Brain: No evidence of acute infarction, hemorrhage, hydrocephalus,
extra-axial collection or mass lesion/mass effect.

Vascular: No hyperdense vessel or unexpected calcification.

Skull: Normal. Negative for fracture or focal lesion.

Sinuses/Orbits: Polypoid mucosal thickening of the ethmoid and less
so frontal sinuses.

Other: None.
IMPRESSION: 1. No acute intracranial abnormality.
2. Polypoid mucosal thickening of the ethmoid and less so frontal
sinuses suggestive of chronic sinusitis.

## 2022-12-13 ENCOUNTER — Ambulatory Visit
Admission: EM | Admit: 2022-12-13 | Discharge: 2022-12-13 | Disposition: A | Discharge status: Home / Self Care | Payer: Self-pay | Attending: Emergency Medicine | Admitting: Emergency Medicine

## 2022-12-13 DIAGNOSIS — S0502XA Injury of conjunctiva and corneal abrasion without foreign body, left eye, initial encounter: Secondary | ICD-10-CM

## 2022-12-13 DIAGNOSIS — S0592XA Unspecified injury of left eye and orbit, initial encounter: Secondary | ICD-10-CM

## 2022-12-13 MED ORDER — KETOROLAC TROMETHAMINE 0.5 % OP SOLN: 1.0000 [drp] | Freq: Four times a day (QID) | OPHTHALMIC | 0 refills | Status: AC

## 2022-12-13 MED ORDER — OFLOXACIN 0.3 % OP SOLN: 1.0000 [drp] | Freq: Four times a day (QID) | OPHTHALMIC | 0 refills | Status: DC

## 2022-12-13 NOTE — Discharge Instructions (Addendum)
You have sustained an injury to your cornea as a result of the rock struck in your eye yesterday.  You do need to be on some antibiotic eyedrops to prevent infection and you need to follow-up with ophthalmology on Monday morning.  Instill 1 drop of Ocuflox in your left eye every 6 hours.  If you develop any discomfort you can instill 1 drop of ketorolac in your left eye every 6 hours.  If you develop any changes in vision, pus drainage from your eye, or increased discomfort I recommend that you contact the ophthalmologist on-call for [facility].  You can call the office at [phone].  You can also call this number Monday morning to arrange for an appointment.  Tell them that Dr. Inez Pilgrim wanted you seen Monday in clinic.

## 2022-12-13 NOTE — ED Provider Notes (Addendum)
[facility]    CSN: [id number] Arrival date & time: 12/13/22  1156      History   Chief Complaint Chief Complaint  Patient presents with   Eye Injury    HPI Tony Graves is a 57 y.o. male.   HPI  57 year old male with a past medical history significant for cardiomegaly, hypercholesterolemia, history of PE, aortic aneurysm, and prediabetes presents for evaluation of an injury to his left eye.  He reports that he was using his weedeater yesterday and was struck in his left eye by a rock.  He denies any pain or changes in vision.  His left eye is red and bloody.  He has not noticed any drainage from the eye.  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 Authorizing Provider  ketorolac (ACULAR) 0.5 % ophthalmic solution Place 1 drop into both eyes every 6 (six) hours. 12/13/22  Yes Becky Augusta, NP  ofloxacin (OCUFLOX) 0.3 % ophthalmic solution Place 1 drop into the left eye 4 (four) times daily. 12/13/22  Yes Becky Augusta, NP  aspirin EC 81 MG tablet Take 81 mg by  mouth daily. Swallow whole.    [provider]  atenolol (TENORMIN) 25 MG tablet Take 1 tablet (25 mg total) by mouth daily. 03/20/20   Lamptey, Britta Mccreedy, MD  atorvastatin (LIPITOR) 10 MG tablet Take 1 tablet (10 mg total) by mouth daily. 10/31/20   Tommie Sams, DO  hydrochlorothiazide (HYDRODIURIL) 25 MG tablet Take 1 tablet (25 mg total) by mouth daily. 10/31/20   Tommie Sams, DO  amLODipine (NORVASC) 10 MG tablet Take 1 tablet (10 mg total) by mouth daily. 03/20/20 04/25/20  LampteyBritta Mccreedy, MD    Family History Family History  Problem Relation Age of Onset   Hypertension Mother    Stroke Mother    Cancer Father     Social History Social History   Tobacco Use   Smoking status: Every Day    Packs/day: .54    Types: Cigarettes   Smokeless tobacco: Never  Vaping Use   Vaping Use: Never used  Substance Use Topics   Alcohol use: Yes    Comment: 2-3 days weekly   Drug use: Never     Allergies   Lisinopril and Penicillins   Review of Systems Review of Systems  Constitutional:  Negative for fever.  Eyes:  Positive for redness. Negative for photophobia, pain and visual disturbance.     Physical Exam Triage Vital Signs ED Triage Vitals [12/13/22 1204]  Enc Vitals  Group     BP      Pulse      Resp      Temp      Temp src      SpO2      Weight 185 lb (83.9 kg)     Height 5\' 11"  (1.803 m)     Head Circumference      Peak Flow      Pain Score 0     Pain Loc      Pain Edu?      Excl. in GC?    No data found.  Updated Vital Signs BP (!) 144/83 (BP Location: Right Arm)   Pulse 63   Temp 98.2 F (36.8 C) (Oral)   Resp 16   Ht 5\' 11"  (1.803 m)   Wt 185 lb (83.9 kg)   SpO2 97%   BMI 25.80 kg/m   Visual Acuity Right Eye Distance:   Left Eye Distance:   Bilateral Distance:    Right Eye Near:   Left Eye Near:    Bilateral Near:     Physical Exam Vitals and nursing note reviewed.  Constitutional:      Appearance: Normal appearance. He is  not diaphoretic.  HENT:     Head: Normocephalic and atraumatic.  Eyes:     General: No scleral icterus.       Left eye: No discharge.     Extraocular Movements: Extraocular movements intact.     Pupils: Pupils are equal, round, and reactive to light.     Comments: Patient is a marked subconjunctival hemorrhage to the lower hemisphere of his eye but no appreciable air-fluid levels when inspecting the eye with ophthalmoscope.  Patient has normal red light reflex and is pupils equal round reactive.  EOMs intact.  Skin:    General: Skin is warm and dry.     Capillary Refill: Capillary refill takes less than 2 seconds.  Neurological:     General: No focal deficit present.     Mental Status: He is alert and oriented to person, place, and time.      UC Treatments / Results  Labs (all labs ordered are listed, but only abnormal results are displayed) Labs Reviewed - No data to display  EKG   Radiology No results found.  Procedures Procedures (including critical care time)  Medications Ordered in UC Medications - No data to display  Initial Impression / Assessment and Plan / UC Course  I have reviewed the triage vital signs and the nursing notes.  Pertinent labs & imaging results that were available during my care of the patient were reviewed by me and considered in my medical decision making (see chart for details).   Patient is a nontoxic-appearing 57 year old male presenting for evaluation of left eye injury as outlined in HPI above.    As you can see in images above, the lower hemisphere of the eye shows some conjunctival hemorrhaging with a pooling of blood in the inferior central portion of the sclera below the iris.  I did anesthetize the eye with 2 drops of tetracaine and instilled fluorescein dye for Woods lamp examination.  Though poorly visualized in this image there is an apparent corneal injury near the 9 o'clock position of the iris with an abrasion across the lower  portion of the cornea.  AMION indicates that Dr. Inez Pilgrim is on for unassigned call for [facility].  I have paged Dr. Inez Pilgrim through [place] on regarding this  patient.  Dr. Presentation returned call and states that he would treat him for a corneal abrasion with ofloxacin ophthalmic 4 times daily as well as ketorolac as needed for pain.  He needs to follow-up with them in the office on Monday.  I will give contact information should he have any changes in his symptomology in the interim.  Final Clinical Impressions(s) / UC Diagnoses   Final diagnoses:  Abrasion of left cornea, initial encounter  Eye injury, non-penetrating, left, initial encounter     Discharge Instructions      You have sustained an injury to your cornea as a result of the rock struck in your eye yesterday.  You do need to be on some antibiotic eyedrops to prevent infection and you need to follow-up with ophthalmology on Monday morning.  Instill 1 drop of Ocuflox in your left eye every 6 hours.  If you develop any discomfort you can instill 1 drop of ketorolac in your left eye every 6 hours.  If you develop any changes in vision, pus drainage from your eye, or increased discomfort I recommend that you contact the ophthalmologist on-call for [facility].  You can call the office at [phone].  You can also call this number Monday morning to arrange for an appointment.  Tell them that Dr. Inez Pilgrim wanted you seen Monday in clinic.     ED Prescriptions     Medication Sig Dispense Auth. Provider   ketorolac (ACULAR) 0.5 % ophthalmic solution Place 1 drop into both eyes every 6 (six) hours. 5 mL Becky Augusta, NP   ofloxacin (OCUFLOX) 0.3 % ophthalmic solution Place 1 drop into the left eye 4 (four) times daily. 5 mL Becky Augusta, NP      PDMP not reviewed this encounter.   Becky Augusta, NP 12/13/22 1231    Becky Augusta, NP 12/13/22 [phone]

## 2022-12-13 NOTE — ED Triage Notes (Signed)
Pt was weed eating yesterday and a rock hit his left eye.  Sclera reddened. No pain  VISUAL ACUITY: Right eye uncorrected 20/50 Left eye uncorrected 20/50 Both eyes uncorrected 20/30

## 2022-12-22 ENCOUNTER — Ambulatory Visit
Admission: EM | Admit: 2022-12-22 | Discharge: 2022-12-22 | Disposition: A | Discharge status: Home / Self Care | Payer: Self-pay | Attending: Family Medicine | Admitting: Family Medicine

## 2022-12-22 DIAGNOSIS — M25561 Pain in right knee: Secondary | ICD-10-CM

## 2022-12-22 MED ORDER — PREDNISONE 10 MG (21) PO TBPK: ORAL_TABLET | Freq: Every day | ORAL | 0 refills | Status: DC

## 2022-12-22 MED ORDER — NAPROXEN 500 MG PO TABS: 500.0000 mg | ORAL_TABLET | Freq: Two times a day (BID) | ORAL | 0 refills | Status: DC

## 2022-12-22 NOTE — Discharge Instructions (Addendum)
If medication was prescribed, stop by the pharmacy to pick up your prescriptions.  For your  pain, Take 1500 mg Tylenol twice a day with Naprosyn (500 mg) twice a day  as needed for pain. Wear your knee brace.  Rest and elevate the affected painful area.  Apply warm compresses intermittently, as needed.  As pain recedes, begin normal activities slowly as tolerated.  Follow up with primary care provider or an orthopedic provider, if symptoms persist.  Watch for worsening symptoms such as an increasing weakness or loss of sensation, increasing pain and/or the loss of bladder or bowel function. Should any of these occur, go to the emergency department immediately.

## 2022-12-22 NOTE — ED Triage Notes (Signed)
Pt c/o R knee pain x3 days. States has fluid in knee & needs it drained.

## 2022-12-22 NOTE — ED Provider Notes (Signed)
[facility]    CSN: [id number] Arrival date & time: 12/22/22  1755      History   Chief Complaint Chief Complaint  Patient presents with   Knee Pain    HPI  HPI Tony Graves is a 57 y.o. male.   Kazuto presents for right knee pain and swelling that started 3 days ago.  He feels like he needs to have more fluid drained off his knee as this helped in the past.       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 Authorizing Provider  aspirin EC 81 MG tablet Take 81 mg by mouth daily. Swallow whole.   Yes [provider]  atenolol (TENORMIN) 25 MG tablet Take 1 tablet (25 mg total) by mouth daily. 03/20/20  Yes Lamptey, Britta Mccreedy, MD  atorvastatin (LIPITOR) 10 MG tablet Take 1 tablet (10 mg total) by mouth daily. 10/31/20  Yes Cook, Jayce G, DO  hydrochlorothiazide (HYDRODIURIL) 25 MG tablet Take 1 tablet (25 mg total) by mouth daily. 10/31/20  Yes Cook, Jayce G, DO  ketorolac (ACULAR) 0.5 % ophthalmic solution Place 1 drop into both eyes every 6 (six) hours.  12/13/22  Yes Becky Augusta, NP  naproxen (NAPROSYN) 500 MG tablet Take 1 tablet (500 mg total) by mouth 2 (two) times daily. 12/22/22  Yes Chrystian Cupples, DO  ofloxacin (OCUFLOX) 0.3 % ophthalmic solution Place 1 drop into the left eye 4 (four) times daily. 12/13/22  Yes Becky Augusta, NP  predniSONE (STERAPRED UNI-PAK 21 TAB) 10 MG (21) TBPK tablet Take by mouth daily. Take 6 tabs by mouth daily for 1, then 5 tabs for 1 day, then 4 tabs for 1 day, then 3 tabs for 1 day, then 2 tabs for 1 day, then 1 tab for 1 day. 12/22/22  Yes Dionisios Ricci, DO  amLODipine (NORVASC) 10 MG tablet Take 1 tablet (10 mg total) by mouth daily. 03/20/20 04/25/20  LampteyBritta Mccreedy, MD    Family History Family History  Problem Relation Age of Onset   Hypertension Mother    Stroke Mother    Cancer Father     Social History Social History   Tobacco Use   Smoking status: Every Day    Current packs/day: 0.75    Types: Cigarettes   Smokeless tobacco: Never  Vaping Use   Vaping status: Never Used  Substance Use Topics   Alcohol use: Yes    Comment: 2-3 days weekly   Drug use: Never     Allergies  Lisinopril and Penicillins   Review of Systems Review of Systems: :negative unless otherwise stated in HPI.      Physical Exam Triage Vital Signs ED Triage Vitals  Enc Vitals Group     BP 12/22/22 1828 (!) 142/82     Pulse Rate 12/22/22 1828 (!) 57     Resp 12/22/22 1828 16     Temp 12/22/22 1828 99.1 F (37.3 C)     Temp Source 12/22/22 1828 Oral     SpO2 12/22/22 1828 96 %     Weight 12/22/22 1827 185 lb (83.9 kg)     Height 12/22/22 1827 5\' 11"  (1.803 m)     Head Circumference --      Peak Flow --      Pain Score 12/22/22 1837 8     Pain Loc --      Pain Edu? --      Excl. in GC? --    No data found.  Updated Vital Signs BP (!) 142/82 (BP Location: Left Arm)   Pulse (!) 57   Temp 99.1 F (37.3 C) (Oral)   Resp 16   Ht 5\' 11"  (1.803 m)   Wt 83.9 kg   SpO2 96%   BMI 25.80 kg/m    Visual Acuity Right Eye Distance:   Left Eye Distance:   Bilateral Distance:    Right Eye Near:   Left Eye Near:    Bilateral Near:     Physical Exam GEN: well appearing male in no acute distress  CVS: well perfused  RESP: speaking in full sentences without pause, no respiratory distress  MSK: Right Knee Exam -Inspection: no deformity, no discoloration -Palpation: no joint line tenderness, anterior lateral effusion -ROM: good flexion though limited by edema and full extension  -Special Tests: Varus Stress: Negative; Valgus Stress: Negative; Anterior: Negative; Posterior drawer: Negative -Limb neurovascularly intact, no instability noted    UC Treatments / Results  Labs (all labs ordered are listed, but only abnormal results are displayed) Labs Reviewed - No data to display  EKG   Radiology No results found.   Procedures Procedures (including critical care time)  Medications Ordered in UC Medications - No data to display  Initial Impression / Assessment and Plan / UC Course  I have reviewed the triage vital signs and the nursing notes.  Pertinent labs & imaging results that were available during my care of the patient were reviewed by me and considered in my medical decision making (see chart for details).      Pt is a 57 y.o.  male with acute on chronic right knee pain without any known injury or trauma.   On exam, pt has effusion with mild anterior lateral tenderness concerning for flare of osteoarthritis versus gout.  Not likely septic joint. Pt afebrile. Unfortunately we do not have the ability to test his knee fluid here.  Recommended he be seen at [facility] for arthrocentesis if symptoms do not resolve with steroid taper.  He does not get off until 6 PM therefore given contact information for [facility] in [place]. Given knee brace prior to discharge.   Patient to gradually return to normal activities, as tolerated and continue ordinary activities  within the limits permitted by pain. Prescribed Naproxen sodium  and steroid taper for pain relief.  Tylenol PRN. Advised patient to avoid OTC NSAIDs while taking prescription NSAID. Counseled patient on red flag symptoms and when to seek immediate care.   Patient to follow up with  orthopedic provider, if symptoms do not improve with conservative treatment.  Return and ED precautions given. Understanding voiced. Discussed MDM, treatment plan and plan for follow-up with patient who agrees with plan.   Final Clinical Impressions(s) / UC Diagnoses   Final diagnoses:  Acute pain of right knee     Discharge Instructions      If medication was prescribed, stop by the pharmacy to pick up your prescriptions.  For your  pain, Take 1500 mg Tylenol twice a day with Naprosyn (500 mg) twice a day  as needed for pain. Wear your knee brace.  Rest and elevate the affected painful area.  Apply warm compresses intermittently, as needed.  As pain recedes, begin normal activities slowly as tolerated.  Follow up with primary care provider or an orthopedic provider, if symptoms persist.  Watch for worsening symptoms such as an increasing weakness or loss of sensation, increasing pain and/or the loss of bladder or bowel function. Should any of these occur, go to the emergency department immediately.        ED Prescriptions     Medication Sig Dispense Auth. Provider   predniSONE (STERAPRED UNI-PAK 21 TAB) 10 MG (21) TBPK tablet Take by mouth daily. Take 6 tabs by mouth daily for 1, then 5 tabs for 1 day, then 4 tabs for 1 day, then 3 tabs for 1 day, then 2 tabs for 1 day, then 1 tab for 1 day. 21 tablet Marci Polito, DO   naproxen (NAPROSYN) 500 MG tablet Take 1 tablet (500 mg total) by mouth 2 (two) times daily. 30 tablet Katha Cabal, DO      PDMP not reviewed this encounter.   Katha Cabal, DO 12/25/22 [phone]

## 2022-12-24 ENCOUNTER — Other Ambulatory Visit: Payer: Self-pay

## 2022-12-24 ENCOUNTER — Emergency Department
Admission: EM | Admit: 2022-12-24 | Discharge: 2022-12-24 | Disposition: A | Discharge status: Home / Self Care | Payer: Self-pay | Attending: Emergency Medicine | Admitting: Emergency Medicine

## 2022-12-24 ENCOUNTER — Encounter: Payer: Self-pay | Admitting: Emergency Medicine

## 2022-12-24 DIAGNOSIS — M25561 Pain in right knee: Secondary | ICD-10-CM | POA: Insufficient documentation

## 2022-12-24 MED ORDER — KETOROLAC TROMETHAMINE 15 MG/ML IJ SOLN
15.0000 mg | Freq: Once | INTRAMUSCULAR | Status: AC
  Administered 2022-12-24: 15 mg via INTRAMUSCULAR
  Filled 2022-12-24: qty 1

## 2022-12-24 MED ORDER — LIDOCAINE HCL 1 % IJ SOLN
5.0000 mL | Freq: Once | INTRAMUSCULAR | Status: AC
  Administered 2022-12-24: 5 mL
  Filled 2022-12-24: qty 10

## 2022-12-24 NOTE — ED Provider Notes (Signed)
[facility] Provider Note  Patient Contact: 7:55 PM (approximate)   History   Knee Pain   HPI  Tony Graves is a 57 y.o. male presents to the emergency department referred by urgent care for therapeutic arthrocentesis.  Patient states that he has had to have arthrocentesis in the past due to knee effusion.  He denies fever or chills and has full range of motion at the right knee.  No falls or other mechanisms of trauma.  No hardware in the right knee.      Physical Exam   Triage Vital Signs: ED Triage Vitals  Enc Vitals Group     BP 12/24/22 1803 (!) 140/92     Pulse Rate 12/24/22 1803 70     Resp 12/24/22 1803 18     Temp 12/24/22 1803 98.6 F (37 C)     Temp Source 12/24/22 1803 Oral     SpO2 12/24/22 1803 97 %     Weight --      Height --      Head Circumference --      Peak Flow --      Pain Score 12/24/22 1802 7     Pain Loc --      Pain Edu? --      Excl. in GC? --     Most recent vital signs: Vitals:   12/24/22 1803  BP: (!) 140/92  Pulse: 70  Resp: 18  Temp: 98.6 F (37 C)  SpO2: 97%     General: Alert and in no acute distress. Eyes:  PERRL. EOMI. Head: No acute traumatic findings ENT:      Nose: No congestion/rhinnorhea.      Mouth/Throat: Mucous membranes are moist.  Neck: No stridor. No cervical spine tenderness to palpation. Cardiovascular:  Good peripheral perfusion Respiratory: Normal respiratory effort without tachypnea or retractions. Lungs CTAB. Good air entry to the bases with no decreased or absent breath sounds. Gastrointestinal: Bowel sounds 4 quadrants. Soft and nontender to palpation. No guarding or rigidity. No palpable masses. No distention. No CVA tenderness. Musculoskeletal: Full range of motion to all extremities.  Patient has loss of peripatellar dimpling on the right.  Palpable dorsalis pedis pulse bilaterally and symmetrically. Neurologic:  No gross focal neurologic deficits are appreciated.   Skin:   No rash noted    ED Results / Procedures / Treatments   Labs (all labs ordered are listed, but only abnormal results are displayed) Labs Reviewed - No data to display     PROCEDURES:  Critical Care performed: No  .Joint Aspiration/Arthrocentesis  Date/Time: 12/24/2022 7:58 PM  Performed by: Orvil Feil, PA-C Authorized by: Orvil Feil, PA-C   Consent:    Risks discussed:  Bleeding and infection Universal protocol:    Procedure explained and questions answered to patient or proxy's satisfaction: yes     Patient identity confirmed:  Verbally with patient Location:    Location:  Knee   Knee:  R knee Anesthesia:    Anesthesia method:  Local infiltration   Local anesthetic:  Lidocaine 1% w/o epi Procedure details:    Needle gauge:  18 G   Ultrasound guidance: no     Approach:  Lateral   Aspirate characteristics:  Yellow   Steroid injected: no     Specimen collected: no   Post-procedure details:    Procedure completion:  Tolerated well, no immediate complications    MEDICATIONS ORDERED IN ED: Medications  ketorolac (TORADOL) 15  MG/ML injection 15 mg (has no administration in time range)  lidocaine (XYLOCAINE) 1 % (with pres) injection 5 mL (5 mLs Infiltration Given by Other 12/24/22 1945)     IMPRESSION / MDM / ASSESSMENT AND PLAN / ED COURSE  I reviewed the triage vital signs and the nursing notes.                              Assessment and plan:  Right knee pain 57 year old male presents to the emergency department with acute right knee pain and request for therapeutic arthrocentesis.  Arthrocentesis occurred in the emergency department without complication approximately 50 cc of clear yellow synovial fluid was aspirated.  Recommended rest, ice, compression elevation at home and patient was given injection of Toradol prior to discharge.  Return precautions were given to return with new or worsening symptoms.  All patient questions were  answered.     FINAL CLINICAL IMPRESSION(S) / ED DIAGNOSES   Final diagnoses:  Acute pain of right knee     Rx / DC Orders   ED Discharge Orders     None        Note:  This document was prepared using Dragon voice recognition software and may include unintentional dictation errors.   Pia Mau Norvelt, PA-C 12/24/22 Babette Relic    Pilar Jarvis, MD 12/25/22 1118

## 2022-12-24 NOTE — ED Triage Notes (Signed)
Patient to ED via POV for right knee pain. States he has been taking prednisone but not getting better. States he needs fluid drawn off the knee per UC MD.

## 2023-01-05 ENCOUNTER — Emergency Department
Admission: EM | Admit: 2023-01-05 | Discharge: 2023-01-05 | Disposition: A | Discharge status: Home / Self Care | Payer: Self-pay | Attending: Emergency Medicine | Admitting: Emergency Medicine

## 2023-01-05 ENCOUNTER — Other Ambulatory Visit: Payer: Self-pay

## 2023-01-05 DIAGNOSIS — M25561 Pain in right knee: Secondary | ICD-10-CM | POA: Insufficient documentation

## 2023-01-05 MED ORDER — LIDOCAINE HCL 1 % IJ SOLN
5.0000 mL | Freq: Once | INTRAMUSCULAR | Status: AC
  Filled 2023-01-05: qty 10

## 2023-01-05 MED ORDER — BUPIVACAINE HCL 0.5 % IJ SOLN
5.0000 mL | Freq: Once | INTRAMUSCULAR | Status: AC
  Administered 2023-01-05: 5 mL

## 2023-01-05 MED ORDER — LIDOCAINE HCL 1 % IJ SOLN
INTRAMUSCULAR | Status: AC
  Administered 2023-01-05: 5 mL
  Filled 2023-01-05: qty 10

## 2023-01-05 NOTE — ED Triage Notes (Signed)
Pt to ED for continued pain and fluid to right knee. States had fluid drawn off on 7/10. Requesting fluid drawn off and steroid injection to knee.  Ambulatory to triage.

## 2023-01-05 NOTE — Discharge Instructions (Signed)
Take NSAIDs as needed for pain. Rest. Use warm compresses to decrease fluid buildup. Follow-up with orthopedic for possible corticosteroid injection.  Continue to wear knee brace for comfort.

## 2023-01-05 NOTE — ED Provider Notes (Signed)
[facility] Emergency Department Provider Note     Event Date/Time   First MD Initiated Contact with Patient 01/05/23 1809     (approximate)   History   Knee Pain   HPI  Tony Graves is a 57 y.o. male presents to the ED for excess fluid in his right knee.  Patient is requesting for a therapeutic arthrocentesis and cortisone steroid injection.  Patient has reoccurring knee effusion history.  Reports no trauma or injury to the knee.  Endorses complete range of motion with no pain.  Patient denies fever and chills.    Physical Exam   Triage Vital Signs: ED Triage Vitals  Encounter Vitals Group     BP 01/05/23 1738 (!) 143/101     Systolic BP Percentile --      Diastolic BP Percentile --      Pulse Rate 01/05/23 1738 68     Resp 01/05/23 1738 18     Temp 01/05/23 1738 98.3 F (36.8 C)     Temp src --      SpO2 01/05/23 1738 99 %     Weight 01/05/23 1739 185 lb (83.9 kg)     Height 01/05/23 1739 5\' 11"  (1.803 m)     Head Circumference --      Peak Flow --      Pain Score 01/05/23 1739 8     Pain Loc --      Pain Education --      Exclude from Growth Chart --    Most recent vital signs: Vitals:   01/05/23 1738  BP: (!) 143/101  Pulse: 68  Resp: 18  Temp: 98.3 F (36.8 C)  SpO2: 99%   General Awake, no distress.  Well-appearing HEENT NCAT. PERRL. EOMI.  CV:  Good peripheral perfusion. RRR RESP:  Normal effort.  ABD:  No distention.  Other:  Right knee reveals moderate fluid upon palpation. Visible edema. No erythema.  AROM. NTTP. Neurovascular intact.    ED Results / Procedures / Treatments   Labs (all labs ordered are listed, but only abnormal results are displayed) Labs Reviewed - No data to display  History and physical examination do not warrant a lab work up or imaging at this time.   No results found.  PROCEDURES:  Critical Care performed: No  .Joint Aspiration/Arthrocentesis  Date/Time: 01/05/2023 7:07  PM  Performed by: Orvil Feil, PA-C Authorized by: Romeo Apple, Elodia Haviland A, PA-C   Consent:    Consent obtained:  Verbal   Consent given by:  Patient   Risks discussed:  Bleeding, infection and incomplete drainage Location:    Location:  Knee Anesthesia:    Anesthesia method:  Local infiltration   Local anesthetic:  Lidocaine 1% WITH epi Procedure details:    Needle gauge:  18 G   Ultrasound guidance: no     Approach:  Lateral   Aspirate amount:  50cc   Aspirate characteristics:  Yellow   Steroid injected: no     Specimen collected: no   Post-procedure details:    Dressing:  Adhesive bandage   Procedure completion:  Tolerated   MEDICATIONS ORDERED IN ED: Medications  lidocaine (XYLOCAINE) 1 % (with pres) injection 5 mL (5 mLs Infiltration Given by Other 01/05/23 1841)  bupivacaine (MARCAINE) 0.5 % (with pres) injection 5 mL (5 mLs Infiltration Given by Other 01/05/23 1841)   IMPRESSION / MDM / ASSESSMENT AND PLAN / ED COURSE  I reviewed the triage vital signs  and the nursing notes.                               57 y.o. male presents to the emergency department for therapeutic arthrocentesis of the right knee due to worsening pain. See HPI for further details. Vital signs are stable and physical exam are pertinent for noninfectious joint effusion.   Differential diagnosis includes, but is not limited to knee effusion, runner's knee, septic bursitis, DVT.  Patient was seen on 07/10 with same complaint.  A therapeutic arthrocentesis was performed.  Immediate relief noted by the patient.  He reports 3 to 4 days fluid returned.  He is requesting for a corticosteroid injection into the joint.  He is informed that that procedure cannot be performed in this ED.  A therapeutic arthrocentesis is performed by myself and Pia Mau, PA-C.  Please see procedure note.  Patient is given orthopedic follow-up.  Encouraged to rest and apply warm compression to the area and keep the limb elevated.  Patient is given ED precautions to return to the ED for any worsening or new symptoms. Patient verbalizes understanding. All questions and concerns were addressed during ED visit.    Patient's presentation is most consistent with acute complicated illness / injury requiring diagnostic workup.  FINAL CLINICAL IMPRESSION(S) / ED DIAGNOSES   Final diagnoses:  Right knee pain, unspecified chronicity   Rx / DC Orders   ED Discharge Orders     None        Note:  This document was prepared using Dragon voice recognition software and may include unintentional dictation errors.    Romeo Apple, Maggi Hershkowitz A, PA-C 01/05/23 1911    Jene Every, MD 01/05/23 [phone]

## 2023-04-25 ENCOUNTER — Emergency Department
Admission: EM | Admit: 2023-04-25 | Discharge: 2023-04-25 | Disposition: A | Discharge status: Home / Self Care | Payer: Self-pay | Attending: Emergency Medicine | Admitting: Emergency Medicine

## 2023-04-25 ENCOUNTER — Other Ambulatory Visit: Payer: Self-pay

## 2023-04-25 DIAGNOSIS — M25461 Effusion, right knee: Secondary | ICD-10-CM | POA: Insufficient documentation

## 2023-04-25 MED ORDER — LIDOCAINE HCL (PF) 1 % IJ SOLN
5.0000 mL | Freq: Once | INTRAMUSCULAR | Status: AC
  Administered 2023-04-25: 5 mL via INTRADERMAL
  Filled 2023-04-25: qty 5

## 2023-04-25 NOTE — ED Triage Notes (Signed)
Pt reports swelling to right knee. Pt reports was seen here for the same a month ago and one of the MD's drained it. Pt reports he thinks it needs to be drained again.

## 2023-04-25 NOTE — ED Notes (Signed)
Dr Wells at bedside

## 2023-04-25 NOTE — ED Provider Notes (Addendum)
[facility] Provider Note    Event Date/Time   First MD Initiated Contact with Patient 04/25/23 1411     (approximate)   History   Knee Pain   HPI Tony Graves is a 57 y.o. male presenting today for right knee pain.  Patient has been seen multiple times within the past several months for right knee pain and swelling.  Workup has largely been unremarkable.  Pain symptoms resolved after fluid drainage.  He is getting his insurance established in the area to eventually get orthopedic follow-up for chronic management but likely will not be able to see them until the start of the new year.  Denies any acute trauma.  Denies any redness or overt pain.  Still able to walk on it without issue.  Chart review: Reviewed most recent chart notes noting evidence of swelling and pain that resolves with fluid drainage.     Physical Exam   Triage Vital Signs: ED Triage Vitals  Encounter Vitals Group     BP 04/25/23 1307 134/82     Systolic BP Percentile --      Diastolic BP Percentile --      Pulse Rate 04/25/23 1307 66     Resp 04/25/23 1307 20     Temp 04/25/23 1307 98.4 F (36.9 C)     Temp Source 04/25/23 1307 Oral     SpO2 04/25/23 1307 97 %     Weight 04/25/23 1305 185 lb 3 oz (84 kg)     Height 04/25/23 1305 5\' 11"  (1.803 m)     Head Circumference --      Peak Flow --      Pain Score 04/25/23 1305 8     Pain Loc --      Pain Education --      Exclude from Growth Chart --     Most recent vital signs: Vitals:   04/25/23 1307  BP: 134/82  Pulse: 66  Resp: 20  Temp: 98.4 F (36.9 C)  SpO2: 97%   I have reviewed the vital signs. General:  Awake, alert, no acute distress. Head:  Normocephalic, Atraumatic. EENT:  PERRL, EOMI, Oral mucosa pink and moist, Neck is supple. Cardiovascular: Regular rate, 2+ distal pulses. Respiratory:  Normal respiratory effort, symmetrical expansion, no distress.   Extremities:  Moving all four extremities through full  ROM without pain.  Effusion to right knee with no surrounding erythema or pain with range of motion. Neuro:  Alert and oriented.  Interacting appropriately.   Skin:  Warm, dry, no rash.   Psych: Appropriate affect.    ED Results / Procedures / Treatments   Labs (all labs ordered are listed, but only abnormal results are displayed) Labs Reviewed - No data to display   EKG    RADIOLOGY    PROCEDURES:  Critical Care performed: No  .Joint Aspiration/Arthrocentesis  Date/Time: 04/25/2023 3:05 PM  Performed by: Janith Lima, MD Authorized by: Janith Lima, MD   Consent:    Consent obtained:  Verbal   Consent given by:  Patient   Risks, benefits, and alternatives were discussed: yes     Risks discussed:  Bleeding, infection and incomplete drainage   Alternatives discussed:  No treatment Universal protocol:    Patient identity confirmed:  Verbally with patient Location:    Location:  Knee   Knee:  R knee Anesthesia:    Anesthesia method:  Local infiltration   Local anesthetic:  Lidocaine 1% w/o  epi Procedure details:    Preparation: Patient was prepped and draped in usual sterile fashion     Needle gauge:  18 G   Ultrasound guidance: no     Approach:  Lateral   Aspirate amount:  55   Aspirate characteristics:  Clear and yellow   Steroid injected: no   Post-procedure details:    Dressing:  Sterile dressing   Procedure completion:  Tolerated well, no immediate complications    MEDICATIONS ORDERED IN ED: Medications  lidocaine (PF) (XYLOCAINE) 1 % injection 5 mL (has no administration in time range)     IMPRESSION / MDM / ASSESSMENT AND PLAN / ED COURSE  I reviewed the triage vital signs and the nursing notes.                              Differential diagnosis includes, but is not limited to, idiopathic right knee effusion  Patient's presentation is most consistent with acute, uncomplicated illness.  Patient is a 57 year old male presenting today for  right knee swelling.  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  Been in the ED several times have it drained in the past as well.  No acute trauma.  Able to ambulate on it.  No erythema, warmth, or pain with range of motion.  No suspicion for septic knee.  Do not think he warrants further x-rays either at this time.  Patient had 55 cc of clear yellow fluid drained from the right knee with improvement in symptoms.  Given follow-up with orthopedics.     FINAL CLINICAL IMPRESSION(S) / ED DIAGNOSES   Final diagnoses:  Effusion of right knee     Rx / DC Orders   ED Discharge Orders     None        Note:  This document was prepared using Dragon voice recognition software and may include unintentional dictation errors.   Janith Lima, MD 04/25/23 1505    Janith Lima, MD 04/25/23 [phone]

## 2023-05-20 ENCOUNTER — Emergency Department: Admission: EM | Admit: 2023-05-20 | Discharge: 2023-05-20 | Discharge status: Against Medical Advice | Payer: Self-pay

## 2023-08-03 ENCOUNTER — Ambulatory Visit (INDEPENDENT_AMBULATORY_CARE_PROVIDER_SITE_OTHER): Payer: BC Managed Care – PPO | Admitting: Urology

## 2023-08-03 ENCOUNTER — Encounter: Payer: Self-pay | Admitting: Urology

## 2023-08-03 VITALS — BP 126/80 | HR 57 | Ht 71.0 in | Wt 185.0 lb

## 2023-08-03 DIAGNOSIS — R972 Elevated prostate specific antigen [PSA]: Secondary | ICD-10-CM

## 2023-08-03 MED ORDER — TAMSULOSIN HCL 0.4 MG PO CAPS: 0.4000 mg | ORAL_CAPSULE | Freq: Every day | ORAL | 0 refills | Status: DC

## 2023-08-03 NOTE — Progress Notes (Signed)
I, Maysun Anabel Bene, acting as a scribe for Riki Altes, MD., have documented all relevant documentation on the behalf of Riki Altes, MD, as directed by Riki Altes, MD while in the presence of Riki Altes, MD.  08/03/2023 4:34 PM   Tony Graves March 29, 1966 [id number]  Referring provider: Luciana Axe, NP [address] [place],  [place] [address]  Chief Complaint  Patient presents with   Elevated PSA    HPI: Tony Graves is a 58 y.o. male referred for evaluation of an elevated PSA. He presents today with his spouse.   6-9 month history of lower urinary tract symptoms consisting of urinary frequency and nocturia x3-6. PSA 06/30/2023 was elevated at 4.48. Previous PSA results 1.31 December 2020 and 3.07 February 2022. No history of family history of prostate cancer and first-degree relatives.  No recurrent UTIs, gross hematuria, or dysuria.   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 If you have any questions, ask your nurse or doctor.          STOP taking these medications    predniSONE 10 MG (21) Tbpk tablet Commonly known as: STERAPRED UNI-PAK 21 TAB Stopped by: Riki Altes       TAKE these medications    amLODipine 10 MG tablet Commonly known as: NORVASC Take 10 mg by mouth daily.   aspirin EC 81 MG tablet Take 81 mg by mouth daily. Swallow whole.   atenolol 25  MG tablet Commonly known as: TENORMIN Take 1 tablet (25 mg total) by mouth daily.   atorvastatin 10 MG tablet Commonly known as: LIPITOR Take 1 tablet (10 mg total) by mouth daily.   hydrochlorothiazide 25 MG tablet Commonly known as: HYDRODIURIL Take 1 tablet (25 mg total) by mouth daily.   ketorolac 0.5 % ophthalmic solution Commonly known as: ACULAR Place 1 drop into both eyes every 6 (six) hours.   naproxen 500 MG tablet Commonly known as: NAPROSYN Take 1 tablet (500 mg total) by mouth 2 (two) times daily.   ofloxacin 0.3 % ophthalmic solution Commonly known as: OCUFLOX Place 1 drop into the left eye 4 (four) times daily.   tamsulosin 0.4 MG Caps capsule Commonly known as: FLOMAX Take 1 capsule (0.4 mg total) by mouth daily. Started by: Riki Altes   valsartan 40 MG tablet Commonly known as: DIOVAN Take 40 mg by mouth daily.        Allergies:  Allergies  Allergen Reactions   Lisinopril Swelling   Penicillins     Family History: Family History  Problem Relation Age of Onset   Hypertension Mother    Stroke Mother    Cancer Father     Social History:  reports that he has been smoking cigarettes. He has never used  smokeless tobacco. He reports current alcohol use. He reports that he does not use drugs.   Physical Exam: BP 126/80   Pulse (!) 57   Ht 5\' 11"  (1.803 m)   Wt 185 lb (83.9 kg)   BMI 25.80 kg/m   Constitutional:  Alert and oriented, No acute distress. HEENT: [place] AT, moist mucus membranes.  Trachea midline, no masses. Cardiovascular: No clubbing, cyanosis, or edema. Respiratory: Normal respiratory effort, no increased work of breathing. GI: Abdomen is soft, nontender, nondistended, no abdominal masses Skin: No rashes, bruises or suspicious lesions. Neurologic: Grossly intact, no focal deficits, moving all 4 extremities. Psychiatric: Normal mood and affect.  Assessment & Plan:    1. Elevated PSA Benign DRE Although PSA is a prostate  cancer screening test he was informed that cancer is not the most common cause of an elevated PSA. Other potential causes including BPH and inflammation were discussed.  He was informed that the only way to adequately diagnose prostate cancer would be transrectal ultrasound and biopsy of the prostate. The procedure was discussed including potential risks of bleeding and infection/sepsis. He was also informed that a negative biopsy does not conclusively rule out the possibility that prostate cancer may be present and that continued monitoring is required.  The use of newer adjunctive blood and urine tests to predict the probability of high-grade prostate cancer were discussed. The use of multiparametric prostate MRI to evaluate for lesions suspicious for high grade prostate cancer and aid in targeted bx was reviewed.  Continued periodic surveillance was also discussed.  He has a 6-9 month history of lower urinary tract symptoms. His symptoms are bothersome enough that he desires medication. Will start tamsulosin 0.4mg  daily.  Lab visit 1 month for a repeat PSA If PSA remains elevated, we'll schedule prostate MRI.  [facility] [address] [place], [place] [address] [phone]

## 2023-08-06 ENCOUNTER — Encounter: Payer: Self-pay | Admitting: Urology

## 2023-08-24 ENCOUNTER — Ambulatory Visit
Admission: EM | Admit: 2023-08-24 | Discharge: 2023-08-24 | Disposition: A | Discharge status: Home / Self Care | Attending: Emergency Medicine | Admitting: Emergency Medicine

## 2023-08-24 ENCOUNTER — Encounter: Payer: Self-pay | Admitting: Emergency Medicine

## 2023-08-24 DIAGNOSIS — Z8679 Personal history of other diseases of the circulatory system: Secondary | ICD-10-CM

## 2023-08-24 DIAGNOSIS — H6121 Impacted cerumen, right ear: Secondary | ICD-10-CM | POA: Diagnosis not present

## 2023-08-24 MED ORDER — NEOMYCIN-POLYMYXIN-HC 3.5-10000-1 OT SUSP: 4.0000 [drp] | Freq: Four times a day (QID) | OTIC | 0 refills | Status: AC

## 2023-08-24 NOTE — Discharge Instructions (Addendum)
 We have cleaned out your right ear.  You have a small abrasion in your external ear canal, so I am sending you home with antibiotic eardrops to help prevent infection.  Use Debrox in the future to help prevent wax buildup.  Your blood pressure looks really good today.  However, keep a close eye on it.   Decrease your salt intake. diet and exercise will lower your blood pressure significantly. It is important to keep your blood pressure under good control, as having a elevated blood pressure for prolonged periods of time significantly increases your risk of stroke, heart attacks, kidney damage, eye damage, and other problems. Get a validated blood pressure cuff that goes on your arm, not your wrist.  Measure your blood pressure once a day, preferably at the same time every day. Keep a log of this and bring it to your next doctor's appointment.  Bring your blood pressure cuff as well.  Return immediately to the ER if you start having chest pain, headache, problems seeing, problems talking, problems walking, if you feel like you're about to pass out, if you do pass out, if you have a seizure, or for any other concerns.  Go to www.goodrx.com  or www.costplusdrugs.com to look up your medications. This will give you a list of where you can find your prescriptions at the most affordable prices. Or ask the pharmacist what the cash price is, or if they have any other discount programs available to help make your medication more affordable. This can be less expensive than what you would pay with insurance.

## 2023-08-24 NOTE — ED Triage Notes (Signed)
 Pt presents with elevated BP and states he currently is taking medication. He also has bilateral ear fullness.

## 2023-08-24 NOTE — ED Provider Notes (Signed)
 HPI  SUBJECTIVE:  Tony Graves is a 58 y.o. male who presents with 2 issues.  First, he is concerned about his blood pressure.  States that it has been elevated from time to time at home recently.  It is currently normal, and he has no complaints.  He is compliant with his blood pressure medications.  Second, he reports his right ear being "clogged up".  He reports decreased hearing.  No otorrhea, ear pain.  He wears ear buds and uses Q-tips.  He has a past medical history of hypercholesterolemia, remote history of PE currently not on any anticoagulants or antiplatelets, ascending aortic aneurysm, hypertension on four blood pressure medications, and is a smoker.  PCP: [facility].  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     Past Surgical History:  Procedure Laterality Date   COLONOSCOPY WITH PROPOFOL N/A 06/14/2021   Procedure: COLONOSCOPY WITH PROPOFOL;  Surgeon: Regis Bill, MD;  Location: [facility] ENDOSCOPY;  Service: Endoscopy;  Laterality: N/A;    Family History  Problem Relation Age of Onset   Hypertension Mother    Stroke Mother    Cancer Father     Social History   Tobacco Use   Smoking status: Every Day    Current packs/day: 0.75    Types: Cigarettes   Smokeless tobacco: Never  Vaping Use   Vaping status: Never Used  Substance Use Topics   Alcohol use: Yes    Comment: 2-3 days weekly   Drug use: Never    No current facility-administered medications for this encounter.  Current Outpatient Medications:    neomycin-polymyxin-hydrocortisone (CORTISPORIN) 3.5-10000-1 OTIC suspension, Place 4 drops into the right ear 4 (four) times daily for 7 days., Disp: 7.5 mL, Rfl: 0   amLODipine (NORVASC) 10 MG tablet, Take 10 mg by mouth daily., Disp: , Rfl:    aspirin EC 81 MG tablet, Take 81 mg by mouth daily. Swallow whole., Disp: ,  Rfl:    atenolol (TENORMIN) 25 MG tablet, Take 1 tablet (25 mg total) by mouth daily., Disp: 90 tablet, Rfl: 2   atorvastatin (LIPITOR) 10 MG tablet, Take 1 tablet (10 mg total) by mouth daily., Disp: 90 tablet, Rfl: 0   hydrochlorothiazide (HYDRODIURIL) 25 MG tablet, Take 1 tablet (25 mg total) by mouth daily., Disp: 90 tablet, Rfl: 0   ketorolac (ACULAR) 0.5 % ophthalmic solution, Place 1 drop into both eyes every 6 (six) hours., Disp: 5 mL, Rfl: 0   naproxen (NAPROSYN) 500 MG tablet, Take 1 tablet (500 mg total) by mouth 2 (two) times daily., Disp: 30 tablet, Rfl: 0   tamsulosin (FLOMAX) 0.4 MG CAPS capsule, Take 1 capsule (0.4 mg total) by mouth daily., Disp: 30 capsule, Rfl: 0   valsartan (DIOVAN) 40 MG tablet, Take 40 mg by mouth daily., Disp: , Rfl:   Allergies  Allergen Reactions   Lisinopril Swelling   Penicillins      ROS  As noted in HPI.   Physical Exam  BP 120/74 (BP Location: Left Arm)   Pulse 66   Temp 98.6 F (37 C) (Oral)   Resp 16   SpO2 98%   BP Readings from Last 3 Encounters:  08/24/23 120/74  08/03/23 126/80  04/25/23 133/89    Constitutional: Well developed, well nourished, no acute distress Eyes:  EOMI, conjunctiva normal  bilaterally HENT: Normocephalic, atraumatic,mucus membranes moist decreased hearing right ear.  No pain with traction on pinna, palpation of tragus or mastoid.  Cerumen impaction deep in right ear.  Left TM normal. Respiratory: Normal inspiratory effort Cardiovascular: Normal rate GI: nondistended skin: No rash, skin intact Musculoskeletal: no deformities Neurologic: Alert & oriented x 3, no focal neuro deficits Psychiatric: Speech and behavior appropriate   ED Course   Medications - No data to display  Orders Placed This Encounter  Procedures   Ear wax removal    Right ear only    Standing Status:   Standing    Number of Occurrences:   1    No results found for this or any previous visit (from the past 24  hours). No results found.  ED Clinical Impression  1. Hearing loss of right ear due to cerumen impaction   2. History of chronic hypertension      ED Assessment/Plan     Outside records reviewed.  Additional medical history obtained.  1.  Concerns about hypertension.  Discussed with patient that his blood pressure looks good today and been fine over the past few visits.  He states that he is under a lot of stress recently, working 7 days a week.  Advised him to continue monitoring his blood pressure and taking his medications.  He has no complaints today.  2.  Right cerumen impaction.  Will have this irrigated and reevaluate.  On reevaluation, patient states hearing has improved.  He has an abrasion in the external ear canal and some persistent wax, but I am able to see a small amount of his tympanic membrane, which appears intact.  Will send home with Cortisporin eardrops due to the abrasion of the external ear canal.  Advised Debrox, no use of Q-tips in the future.  Work note for few days.  Discussed  MDM, treatment plan, and plan for follow-up with patient.  Written hypertensive emergency precautions given the patient.  patient agrees with plan.   Meds ordered this encounter  Medications   neomycin-polymyxin-hydrocortisone (CORTISPORIN) 3.5-10000-1 OTIC suspension    Sig: Place 4 drops into the right ear 4 (four) times daily for 7 days.    Dispense:  7.5 mL    Refill:  0      *This clinic note was created using Scientist, clinical (histocompatibility and immunogenetics). Therefore, there may be occasional mistakes despite careful proofreading.  ?    Domenick Gong, MD 08/24/23 [phone]

## 2023-08-30 ENCOUNTER — Other Ambulatory Visit: Payer: Self-pay | Admitting: Urology

## 2023-08-31 ENCOUNTER — Other Ambulatory Visit: Payer: Self-pay

## 2023-08-31 ENCOUNTER — Other Ambulatory Visit: Payer: BC Managed Care – PPO

## 2023-08-31 DIAGNOSIS — R972 Elevated prostate specific antigen [PSA]: Secondary | ICD-10-CM

## 2023-09-01 LAB — PSA: Prostate Specific Ag, Serum: 3.9 ng/mL (ref 0.0–4.0)

## 2023-09-09 ENCOUNTER — Telehealth: Payer: Self-pay

## 2023-09-09 DIAGNOSIS — R972 Elevated prostate specific antigen [PSA]: Secondary | ICD-10-CM

## 2023-09-09 NOTE — Telephone Encounter (Signed)
 Spoke with patient regarding results.  Patient agreeable to MRI. Order placed for MRI.  Advised patient of prep.

## 2023-09-09 NOTE — Telephone Encounter (Signed)
-----   Message from Verna [place] [facility] sent at 09/09/2023  7:38 AM EDT ----- Repeat PSA still elevated above baseline.  Recommend prostate MRI for further evaluation.  Please place order and will contact patient with results

## 2023-09-15 ENCOUNTER — Ambulatory Visit
Admission: RE | Admit: 2023-09-15 | Discharge: 2023-09-15 | Disposition: A | Discharge status: Home / Self Care | Source: Ambulatory Visit | Attending: Urology | Admitting: Urology

## 2023-09-15 DIAGNOSIS — R972 Elevated prostate specific antigen [PSA]: Secondary | ICD-10-CM | POA: Insufficient documentation

## 2023-09-15 MED ORDER — GADOBUTROL 1 MMOL/ML IV SOLN
7.5000 mL | Freq: Once | INTRAVENOUS | Status: AC | PRN
  Administered 2023-09-15: 7.5 mL via INTRAVENOUS

## 2023-09-17 ENCOUNTER — Telehealth: Payer: Self-pay | Admitting: Urology

## 2023-09-17 ENCOUNTER — Encounter: Payer: Self-pay | Admitting: Urology

## 2023-09-17 NOTE — Telephone Encounter (Signed)
My Chart message sent earlier today

## 2023-09-17 NOTE — Telephone Encounter (Signed)
 PT called and wanted to know the results from his MRI completed on 09/15/2023. Pt would like a call back [phone]) with the results following from his MRI Scan.

## 2023-09-21 ENCOUNTER — Telehealth: Payer: Self-pay | Admitting: Urology

## 2023-09-21 ENCOUNTER — Encounter: Payer: Self-pay | Admitting: *Deleted

## 2023-09-21 NOTE — Telephone Encounter (Signed)
 Talked with patient sent my chart message and scheduled biopsy for [place]. Instructions reviewed.

## 2023-09-21 NOTE — Telephone Encounter (Signed)
 Patient called and is requesting a call back regarding his MRI results. Please advise patient.

## 2023-10-05 ENCOUNTER — Telehealth: Payer: Self-pay | Admitting: Urology

## 2023-10-05 NOTE — Telephone Encounter (Signed)
 Patient called and would like to know if he can be put on a medication to help with his frequent urination or if he would need to come in and be seen. Patient recently had an MRI done and per the results, something has been spotted on his prostate. Patient is scheduled or a fusion biopsy on 5/30. Please advise patient at [phone] or [phone].

## 2023-10-06 NOTE — Telephone Encounter (Signed)
 Initially tried on tamsulosin .  Have him pick up Gemtesa samples to try

## 2023-10-06 NOTE — Telephone Encounter (Signed)
 Notified patient as instructed, patient pleased

## 2023-11-13 ENCOUNTER — Ambulatory Visit: Admitting: Urology

## 2023-11-13 VITALS — BP 138/80 | HR 55

## 2023-11-13 DIAGNOSIS — N4231 Prostatic intraepithelial neoplasia: Secondary | ICD-10-CM | POA: Diagnosis not present

## 2023-11-13 DIAGNOSIS — Z87898 Personal history of other specified conditions: Secondary | ICD-10-CM | POA: Diagnosis not present

## 2023-11-13 DIAGNOSIS — N429 Disorder of prostate, unspecified: Secondary | ICD-10-CM | POA: Diagnosis not present

## 2023-11-13 DIAGNOSIS — Z792 Long term (current) use of antibiotics: Secondary | ICD-10-CM

## 2023-11-13 DIAGNOSIS — Z2989 Encounter for other specified prophylactic measures: Secondary | ICD-10-CM

## 2023-11-13 DIAGNOSIS — C61 Malignant neoplasm of prostate: Secondary | ICD-10-CM

## 2023-11-13 MED ORDER — GENTAMICIN SULFATE 40 MG/ML IJ SOLN
80.0000 mg | Freq: Once | INTRAMUSCULAR | Status: AC
Start: 2023-11-13 — End: 2023-11-13
  Administered 2023-11-13: 80 mg via INTRAMUSCULAR

## 2023-11-13 MED ORDER — LEVOFLOXACIN 500 MG PO TABS
500.0000 mg | ORAL_TABLET | Freq: Once | ORAL | Status: AC
Start: 2023-11-13 — End: 2023-11-13
  Administered 2023-11-13: 500 mg via ORAL

## 2023-11-13 NOTE — Progress Notes (Signed)
   11/13/23  Indication: 58 year old male with personal history of elevated PSA to 4.48 who presents today for fusion biopsy referred by Dr. Cherylene Corrente.  Prostate MRI shows 2 lesions on the bladder, PI-RADS 4 lesion as well as a PI-RADS 3 lesion.  Prostate volume was 37.5 cc.  MRI Fusion Prostate Biopsy Procedure   Informed consent was obtained, and we discussed the risks of bleeding and infection/sepsis. A time out was performed to ensure correct patient identity.  Pre-Procedure: - Gentamicin  and levaquin  given for antibiotic prophylaxis - No significant hypoechoic or median lobe noted  Procedure: - Prostate block performed using 10 cc 1% lidocaine   - MRI fusion biopsy was performed, and 3 biopsies were taken from the ROI #1 PIRADS 4 lesion of the left posterior lateral peripheral zone in the mid gland as well as 3 biopsies from a PI-RADS 3 lesion at the right anterior transition zone. - Standard biopsies taken from sextant areas, 12 under ultrasound guidance. - Total of 18 cores taken  Post-Procedure: - Patient tolerated the procedure well - He was counseled to seek immediate medical attention if experiences significant bleeding, fevers, or severe pain - Return in one week to discuss biopsy results  Assessment/ Plan: Will follow up in 1-2 weeks to discuss pathology with Dr. Irean Manner, MD

## 2023-11-13 NOTE — Patient Instructions (Signed)

## 2023-11-17 ENCOUNTER — Encounter: Payer: Self-pay | Admitting: Emergency Medicine

## 2023-11-17 ENCOUNTER — Emergency Department
Admission: EM | Admit: 2023-11-17 | Discharge: 2023-11-17 | Disposition: A | Discharge status: Home / Self Care | Attending: Emergency Medicine | Admitting: Emergency Medicine

## 2023-11-17 ENCOUNTER — Other Ambulatory Visit: Payer: Self-pay

## 2023-11-17 DIAGNOSIS — R300 Dysuria: Secondary | ICD-10-CM | POA: Diagnosis present

## 2023-11-17 DIAGNOSIS — E876 Hypokalemia: Secondary | ICD-10-CM | POA: Insufficient documentation

## 2023-11-17 DIAGNOSIS — D72829 Elevated white blood cell count, unspecified: Secondary | ICD-10-CM | POA: Insufficient documentation

## 2023-11-17 DIAGNOSIS — N3001 Acute cystitis with hematuria: Secondary | ICD-10-CM | POA: Diagnosis not present

## 2023-11-17 LAB — BASIC METABOLIC PANEL WITH GFR
Anion gap: 15 (ref 5–15)
BUN: 16 mg/dL (ref 6–20)
CO2: 25 mmol/L (ref 22–32)
Calcium: 9.3 mg/dL (ref 8.9–10.3)
Chloride: 95 mmol/L — ABNORMAL LOW (ref 98–111)
Creatinine, Ser: 1.17 mg/dL (ref 0.61–1.24)
GFR, Estimated: >60 mL/min (ref >60)
Glucose, Bld: 106 mg/dL — ABNORMAL HIGH (ref 70–99)
Potassium: 3.1 mmol/L — ABNORMAL LOW (ref 3.5–5.1)
Sodium: 135 mmol/L (ref 135–145)

## 2023-11-17 LAB — CBC
HCT: 41.1 % (ref 39.0–52.0)
Hemoglobin: 14.1 g/dL (ref 13.0–17.0)
MCH: 31.1 pg (ref 26.0–34.0)
MCHC: 34.3 g/dL (ref 30.0–36.0)
MCV: 90.7 fL (ref 80.0–100.0)
Platelets: 265 10*3/uL (ref 150–400)
RBC: 4.53 MIL/uL (ref 4.22–5.81)
RDW: 14.5 % (ref 11.5–15.5)
WBC: 12.7 10*3/uL — ABNORMAL HIGH (ref 4.0–10.5)
nRBC: 0 % (ref 0.0–0.2)

## 2023-11-17 LAB — URINALYSIS, W/ REFLEX TO CULTURE (INFECTION SUSPECTED)
Bilirubin Urine: NEGATIVE
Glucose, UA: NEGATIVE mg/dL
Ketones, ur: NEGATIVE mg/dL
Nitrite: NEGATIVE
Protein, ur: 30 mg/dL — AB
Specific Gravity, Urine: 1.015 (ref 1.005–1.030)
pH: 7 (ref 5.0–8.0)

## 2023-11-17 LAB — MAGNESIUM: Magnesium: 2.2 mg/dL (ref 1.7–2.4)

## 2023-11-17 MED ORDER — CEFDINIR 300 MG PO CAPS: 300.0000 mg | ORAL_CAPSULE | Freq: Two times a day (BID) | ORAL | 0 refills | Status: AC

## 2023-11-17 MED ORDER — CEFTRIAXONE SODIUM 1 G IJ SOLR
1.0000 g | Freq: Once | INTRAMUSCULAR | Status: AC
  Administered 2023-11-17: 1 g via INTRAMUSCULAR
  Filled 2023-11-17: qty 10

## 2023-11-17 MED ORDER — ONDANSETRON 4 MG PO TBDP: 4.0000 mg | ORAL_TABLET | Freq: Three times a day (TID) | ORAL | 0 refills | Status: AC | PRN

## 2023-11-17 MED ORDER — POTASSIUM CHLORIDE CRYS ER 20 MEQ PO TBCR
40.0000 meq | EXTENDED_RELEASE_TABLET | Freq: Once | ORAL | Status: AC
  Administered 2023-11-17: 40 meq via ORAL
  Filled 2023-11-17: qty 2

## 2023-11-17 NOTE — ED Triage Notes (Signed)
 Patient to ED via POV for post-op problem. PT reports having a prostate biopsy on Friday. States having painful urination and blood in urine since.

## 2023-11-17 NOTE — Discharge Instructions (Addendum)
 You are seen in the emergency department and diagnosed with a urinary tract infection.  Your urine was sent for culture and you will be notified if the antibiotic we started you on is not the antibiotic that grew out in your culture.  Call your urologist and follow-up.  Return to the emergency department if you develop fever or vomiting and cannot keep down your antibiotics.  Drink plenty of fluids.  Your potassium was mildly low when checked today.  Make sure to follow up with a primary doctor to follow up your labs.  Make sure to eat food high in potassium and magnesium - examples - potatoes, spinach, bananas, beans, avocadoes, oranges, nuts.  zofran (ondansetron) - nausea medication, take 1 tablet every 8 hours as needed for nausea/vomiting.

## 2023-11-17 NOTE — ED Notes (Signed)
 Discharge instructions reviewed with patient. Patient questions answered and opportunity for education reviewed. Patient voices understanding of discharge instructions with no further questions. Patient ambulatory with steady gait to lobby.

## 2023-11-17 NOTE — ED Provider Notes (Signed)
 [facility] Provider Note    Event Date/Time   First MD Initiated Contact with Patient 11/17/23 [phone]     (approximate)   History   Post-op Problem   HPI  Tony Graves is a 58 y.o. male presents to the emergency department with blood in his urine and burning with urination.  Difficulty urinating this morning.  Patient had a prostate biopsy on 11/13/2023 by Dr. Ace Holder after referral from Dr. Cherylene Corrente.  Some mild chills yesterday.  No fever.  No nausea or vomiting.  No history of kidney stone.  Not on anticoagulation.         Physical Exam   Triage Vital Signs: ED Triage Vitals [11/17/23 0704]  Encounter Vitals Group     BP (!) 153/90     Systolic BP Percentile      Diastolic BP Percentile      Pulse Rate 77     Resp 17     Temp 98.6 F (37 C)     Temp Source Oral     SpO2 100 %     Weight 190 lb (86.2 kg)     Height 5\' 11"  (1.803 m)     Head Circumference      Peak Flow      Pain Score 0     Pain Loc      Pain Education      Exclude from Growth Chart     Most recent vital signs: Vitals:   11/17/23 0704  BP: (!) 153/90  Pulse: 77  Resp: 17  Temp: 98.6 F (37 C)  SpO2: 100%    Physical Exam Constitutional:      Appearance: He is well-developed.  HENT:     Head: Atraumatic.  Eyes:     Conjunctiva/sclera: Conjunctivae normal.     Pupils: Pupils are equal, round, and reactive to light.  Cardiovascular:     Rate and Rhythm: Regular rhythm.  Pulmonary:     Effort: No respiratory distress.  Abdominal:     General: There is no distension.  Musculoskeletal:     Cervical back: Normal range of motion.  Skin:    General: Skin is warm.  Neurological:     Mental Status: He is alert. Mental status is at baseline.      IMPRESSION / MDM / ASSESSMENT AND PLAN / ED COURSE  I reviewed the triage vital signs and the nursing notes.  Differential diagnosis including urinary retention, urinary tract infection.  Afebrile on arrival,  have a low suspicion for pyelonephritis, no CVA tenderness.  No history of kidney stones have a low suspicion for kidney stones as cause of hematuria.  Did note lesions in his bladder on chart review so possibly secondary to malignancy  On chart review no history of a urinary tract infection  Labs (all labs ordered are listed, but only abnormal results are displayed) Labs interpreted as -    Labs Reviewed  BASIC METABOLIC PANEL WITH GFR - Abnormal; Notable for the following components:      Result Value   Potassium 3.1 (*)    Chloride 95 (*)    Glucose, Bld 106 (*)    All other components within normal limits  CBC - Abnormal; Notable for the following components:   WBC 12.7 (*)    All other components within normal limits  URINALYSIS, W/ REFLEX TO CULTURE (INFECTION SUSPECTED) - Abnormal; Notable for the following components:   Color, Urine YELLOW (*)  APPearance CLEAR (*)    Hgb urine dipstick MODERATE (*)    Protein, ur 30 (*)    Leukocytes,Ua SMALL (*)    Bacteria, UA RARE (*)    All other components within normal limits  URINE CULTURE  MAGNESIUM    Findings concerning for urinary tract infection.  No history of a resistant urinary tract infection.  Patient does have a penicillin allergy but no history of anaphylaxis.  After discussion with pharmacy will start on a cephalosporin.  Given IM Rocephin and will start on cefdinir.  Urine was sent for culture.  Mild hypokalemia and given oral potassium replacement.  Magnesium level within normal limits.  Mild leukocytosis but is otherwise well-appearing with no CVA tenderness.  Discussed close follow-up with his urologist and discussed return precautions.     PROCEDURES:  Critical Care performed: No  Procedures  Patient's presentation is most consistent with acute complicated illness / injury requiring diagnostic workup.   MEDICATIONS ORDERED IN ED: Medications  potassium chloride SA (KLOR-CON M) CR tablet 40 mEq (has no  administration in time range)  cefTRIAXone (ROCEPHIN) injection 1 g (has no administration in time range)    FINAL CLINICAL IMPRESSION(S) / ED DIAGNOSES   Final diagnoses:  Acute cystitis with hematuria  Hypokalemia     Rx / DC Orders   ED Discharge Orders          Ordered    cefdinir (OMNICEF) 300 MG capsule  2 times daily        11/17/23 0817    ondansetron (ZOFRAN-ODT) 4 MG disintegrating tablet  Every 8 hours PRN        11/17/23 0817             Note:  This document was prepared using Dragon voice recognition software and may include unintentional dictation errors.   Viviano Ground, MD 11/17/23 [phone]

## 2023-11-18 LAB — URINE CULTURE: Culture: NO GROWTH

## 2023-11-20 ENCOUNTER — Ambulatory Visit: Admitting: Urology

## 2023-12-02 ENCOUNTER — Other Ambulatory Visit: Admitting: Urology

## 2023-12-07 ENCOUNTER — Telehealth: Payer: Self-pay

## 2023-12-07 NOTE — Telephone Encounter (Signed)
 Patient left voicemail on Surgery line, very upset that he has not received Prostate Biopsy results since he had it done on 11/13/2023. Demanding a phone call back today with results.

## 2023-12-07 NOTE — Telephone Encounter (Signed)
 I contacted Tony Graves to discuss his prostate biopsy report.  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  We discussed NCCN management options including active surveillance, EBRT, brachytherapy and radical prostatectomy.  He is interested in active surveillance and will send pathology for molecular tumor analysis.

## 2023-12-23 ENCOUNTER — Telehealth: Payer: Self-pay | Admitting: Urology

## 2023-12-23 DIAGNOSIS — C61 Malignant neoplasm of prostate: Secondary | ICD-10-CM

## 2023-12-23 NOTE — Telephone Encounter (Signed)
 Pt called today and said it has been 2 weeks and he's waiting on an answer per Stoioff's note:  I will send your pathology to have further molecular testing to see if you would be a good candidate for active surveillance. This takes about 2 weeks.  I wasn't sure where to look and pt is asking questions I don't know the answers to.  Please give him a call today, he is very anxious.

## 2023-12-24 NOTE — Telephone Encounter (Signed)
 Patient called again and states he still has not received a call regarding Stoioff's message. Patient is requesting a call back ASAP to discuss what the plan will be moving further. Patient expressed that he is very concerned and needs to know something today. Please advise

## 2023-12-24 NOTE — Telephone Encounter (Signed)
 Called patient and got voicemail.  His mailbox was full and unable to leave a message.

## 2023-12-25 NOTE — Addendum Note (Signed)
 Addended by: TWYLLA GLENDIA BROCKS on: 12/25/2023 04:30 PM   Modules accepted: Orders

## 2023-12-25 NOTE — Telephone Encounter (Signed)
 Patient would like a call back regarding next steps. Advised that Dr. Twylla is in clinic and will call back once finished. States that he would like his wife called if he is not reachable.

## 2023-12-25 NOTE — Telephone Encounter (Signed)
 Pt called again stating he had not rec'd a call from us .  I read message from Dr. Twylla.  He would like a return call today and will answer.

## 2023-12-25 NOTE — Telephone Encounter (Signed)
 I called Mr. Ralph and got his voicemail again.  I did contact his wife and discussed his molecular testing report which was favorable indicating active surveillance would be a viable option.  I again discussed RALP and radiation modalities.  We discussed the availability of multidisciplinary oncology clinics at [facility] and [facility] which can help them make an informed decision if desired.  She did request a referral to the [facility].

## 2024-01-06 NOTE — Progress Notes (Signed)
 01/08/2024  CHIEF COMPLAINT: Newly diagnosed prostate cancer.  HISTORY OF PRESENT ILLNESS:   The patient is a 58 y.o. African-American male [place] from [facility]) who was recently diagnosed with prostate cancer. He is here for our opinion regarding further discussion of various treatment options and management recommendations. Patient was  self-referred. Briefly, he underwent a prostate biopsy on 11/13/23, secondary to an elevated PSA and abnormal prostate MRI. PSA at the time of diagnosis was 3.9 ng/mL. His prostate biopsy pathology revealed 1 of 14 cores positive for adenocarcinoma of the prostate, Gleason 3+4=7 - 1 cores, in the MRI directed lesion area 1 but all other cores were negative    Saw note from Dr Glendia Edward his outside urologist that molecular testing on the biopsy was favorable   The patient is now here for comprehensive counseling regarding treatment options for his recently diagnosed prostate cancer.      Patient complains of   ED.   Patient denies  decreased-FOS, incontinence, dysuria, nocturia, bone pain, hematuria, urinary retention, recent UTI, constipation, diarrhea, abdominal pain, weight loss, chest pain, history of kidney stone, and bowel discomfort.  ]  AUA symptom score today is not completed  with a bother index of n/c. His current Sexual Health Inventory Score is IIEF is  14 out of 25.    DETAILED GU  MEDICAL HISTORY:     PSA (Prostate Specific Antigen), Total (ng/mL)  Date Value  06/30/2023 4.48 (H)  02/03/2022 3.25  12/26/2020 1.18     PAST MEDICAL HISTORY: Past Medical History:  Diagnosis Date  . History of cataract     PROBLEM LIST: Patient Active Problem List  Diagnosis  . Essential hypertension  . Mild cardiomegaly  . 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 Hx pulmonary embolism  . Heavy alcohol consumption  . Hypercholesterolemia  . Prediabetes  . Low vitamin B12 level  . Chronic low back pain  . Aneurysm of ascending aorta without  rupture ()  . Anemia  . Vasculogenic erectile dysfunction  . Vitamin D deficiency  . Elevated PSA  . Chronic pain of right knee     PAST SURGICAL HISTORY: Past Surgical History:  Procedure Laterality Date  . COLONOSCOPY  06/14/2021   12 mm Tubular adenoma/Repeat 11yrs/CTL     MEDICATIONS:  Current Outpatient Medications:  .  amLODIPine  (NORVASC ) 10 MG tablet, Take 1 tablet (10 mg total) by mouth once daily, Disp: 90 tablet, Rfl: 3 .  aspirin 81 MG EC tablet, Take 1 tablet (81 mg total) by mouth once daily, Disp: 90 tablet, Rfl: 3 .  atenoloL  (TENORMIN ) 25 MG tablet, Take 1 tablet (25 mg total) by mouth once daily, Disp: 90 tablet, Rfl: 3 .  atorvastatin  (LIPITOR) 10 MG tablet, Take 1 tablet (10 mg total) by mouth once daily, Disp: 90 tablet, Rfl: 1 .  cholecalciferol (VITAMIN D3) 5,000 unit capsule, Take 1 capsule (5,000 Units total) by mouth once daily, Disp: 360 capsule, Rfl: 11 .  cyanocobalamin (VITAMIN B-12) 1000 MCG tablet, Take 1 tablet (1,000 mcg total) by mouth once daily, Disp: 90 tablet, Rfl: 3 .  hydroCHLOROthiazide  (HYDRODIURIL ) 25 MG tablet, Take 1 tablet (25 mg total) by mouth once daily, Disp: 90 tablet, Rfl: 3 .  valsartan (DIOVAN) 40 MG tablet, Take 1 tablet (40 mg total) by mouth once daily, Disp: 90 tablet, Rfl: 3 .  sildenafiL (VIAGRA) 100 MG tablet, Take one tablet po 60 minutes prior to intercourse, Disp: 20 tablet, Rfl: 10  ALLERGY:  Allergies  Allergen Reactions  . Lisinopril Swelling    Tongue and mouth swelling   . Penicillins Unknown    SOCIAL HISTORY: Social History   Socioeconomic History  . Marital status: Single  Tobacco Use  . Smoking status: Every Day    Current packs/day: 0.50    Average packs/day: 0.5 packs/day for 20.0 years (10.0 ttl pk-yrs)    Types: Cigarettes  Substance and Sexual Activity  . Alcohol use: Never   Social Drivers of Corporate investment banker Strain: Low Risk  (12/04/2023)   Overall Financial Resource Strain  (CARDIA)   . Difficulty of Paying Living Expenses: Not hard at all  Food Insecurity: No Food Insecurity (12/04/2023)   Hunger Vital Sign   . Worried About Programme researcher, broadcasting/film/video in the Last Year: Never true   . Ran Out of Food in the Last Year: Never true  Transportation Needs: No Transportation Needs (12/04/2023)   PRAPARE - Transportation   . Lack of Transportation (Medical): No   . Lack of Transportation (Non-Medical): No  Physical Activity: Sufficiently Active (12/04/2023)   Exercise Vital Sign   . Days of Exercise per Week: 5 days   . Minutes of Exercise per Session: 30 min  Stress: Stress Concern Present (12/04/2023)   [employer] of Occupational Health - Occupational Stress Questionnaire   . Feeling of Stress : Very much  Social Connections: Moderately Isolated (12/04/2023)   Social Connection and Isolation Panel   . Frequency of Communication with Friends and Family: More than three times a week   . Frequency of Social Gatherings with Friends and Family: Once a week   . Attends Religious Services: Never   . Active Member of Clubs or Organizations: No   . Attends Banker Meetings: Never   . Marital Status: Married  Housing Stability: Unknown (01/08/2024)   Housing Stability Vital Sign   . Unable to Pay for Housing in the Last Year: No   . Number of Times Moved in the Last Year: 0   . Homeless in the Last Year: Patient unable to answer    FAMILY HISTORY:  Family History  Problem Relation Name Age of Onset  . High blood pressure (Hypertension) Mother    . Diabetes Mother    . High blood pressure (Hypertension) Father    . Diabetes Maternal Aunt    . Diabetes Maternal Uncle    . Diabetes Paternal Aunt    . Diabetes Paternal Uncle    . High blood pressure (Hypertension) Maternal Grandmother    . Diabetes Maternal Grandmother    . High blood pressure (Hypertension) Maternal Grandfather    . Diabetes Maternal Grandfather    . High blood pressure (Hypertension)  Paternal Grandmother    . Diabetes Paternal Grandmother    . High blood pressure (Hypertension) Paternal Grandfather    . Diabetes Paternal Grandfather      REVIEW OF SYSTEMS:  A ten point Review of Systems was negative  Genitourinary: Refer to  HPI  PHYSICAL EXAM:   Blood pressure 128/73, pulse 71, temperature 36.8 C (98.2 F), temperature source Oral, resp. rate 20, height 180.3 cm (5' 11), weight 82.9 kg (182 lb 12.2 oz), SpO2 98%.Body mass index is 25.49 kg/m.   General appearance: Patient appears of normal development, appropriately groomed and body habitus and in NAD.  Mental status:-  alert and oriented to person, place and time; normal mood and affect Skin:- No rashes, lesions or ulcers apparent.  Lymphatic:- No palpable cervical, supraclavicular or inguinal adenopathy Vascular: - Peripheral pulses regular throughout Musculoskeletal:-  normal range of motion, good strength, tone station and gait Bladder:  Non-palpable, non-tender  Phallus:  Normal penis without lesion, mass, scarring or deformity.       Urethral meatus: In normal position without lesion, discharge  Scrotum:  Normal without lesion, cyst or rash  Epididymis:  Normal size without mass or tenderness bilaterally  Testes:  Normal size without mass or tenderness bilaterally  Anus and perineum:  Normal, good sphincter tone, no rectal mass    MAGING STUDIES REVIEWED:      Outside records  and results were reviewed with the patient.  LABS REVIEWED:     No results found for: COLORU, PROTEINUA, GLUCOSEU, BLOODU, NITRITE, RBCUA, WBCUA, BACTERIA  PSA (Prostate Specific Antigen), Total (ng/mL)  Date Value  06/30/2023 4.48 (H)  02/03/2022 3.25  12/26/2020 1.18    No results found for: LABTEST No results found for: TESTOSFR   ASSESSMENT: 58 y.o. male with Prostate Adenocarcinoma, with low volume favorable int risk-- patient was initially interested in RAD PROS but after detailed discussion  he is interested in short course EBRT and not keen for RP due to ED risk   ICD-10-CM   1. Prostate cancer (CMS/HHS-HCC)  C61 Ambulatory Referral to Radiation Oncology    2. ED (erectile dysfunction) of organic origin  N52.9 Ambulatory Referral to Radiation Oncology      PLAN:          We undertook a complex discussion with the patient regarding treatment options for newly diagnosed localized prostate cancer. In general, we discussed radical prostatectomy, external beam radiation therapy, brachytherapy, cryotherapy, active surveillance, and other less common alternative treatments including high- intensity focused ultrasound, focal therapy, and primary hormonal therapy. We undertook a complex discussion regarding comparing and contrasting of open Retropubic prostatectomy (RRP) verses robotically assisted laparoscopic prostatectomy (RALPH). We discussed the nature of both of these operations and discussed results specifically related to the [facility]) Outcomes Database. We discussed the risks of impotence and incontinence associated with various treatments for localized prostate cancer. We explained the nature of the nerve-sparing technique as it is applied both to the open and robotic technique. We further discussed the patient's treatment in the context of a risk stratified approach using the low, intermediated, and high- risk stratification scheme. The patient was provided with standardized educational materials. He was also given information on contacting our office and our Physician's Assistant should he need additional information or should he be interested in scheduling surgical or other care with me or our group. He was also offered consultations with my partners regarding robotic prostatectomy, cryotherapy, and other treatment options. 2. Patient was asked to complete  no other testing at this time. 3. Patient was given a referral to  Radiation Oncology  for evaluation 4. Patient  was offered consultation with [facility] Robotic Surgeon but will let us  know after he sees rad onc 5. Discussion about his ED and Rx for Viagra   Time Spent: The total time for the visit was: 60-(C4) minutes of which greater than 50% was spent with the patient in counseling and/or coordination of care.  Next appt: Return if symptoms worsen or fail to improve.  New medications associated with the visit:  Requested Prescriptions   Signed Prescriptions Disp Refills  . sildenafiL (VIAGRA) 100 MG tablet 20 tablet 10    Sig: Take one tablet po 60 minutes prior to intercourse  Attestation Statement:   I personally performed the service. (TP)  JUDD LELON CAPRICE, MD

## 2024-01-18 NOTE — Progress Notes (Signed)
 Radiation Oncology Consultation Note  MRN: [id number] DOB: 1966-01-02 Physician Requesting Consultation: Charles Thelbert Gustin, MD Reason for Consultation: Prostate Cancer History of Present Illness:  Tony Graves [id number] is a 58 y.o. male from [place] [address] who initially presented with an elevated PSA.  09/15/23- MRI prostate  -PIRADS 4 lesion in the left PZ 01/08/24- Prostate biopsy with MR fusion  - PROSTATE BIOPSY CASE SUMMARY  Speci- men Site Diagnosis Grade/ Prognostic Group % Pattern 4 Cancer length (mm) Total length (mm) Additional Findings  A Left base Benign N/A N/A N/A N/A N/A  B Left mid Benign N/A N/A N/A N/A N/A  C Left apex Benign N/A N/A N/A N/A N/A  D Right base Benign N/A N/A N/A N/A N/A  E Right mid Benign N/A N/A N/A N/A N/A  F Right apex Benign N/A N/A N/A N/A N/A  G Left lateral base Benign N/A N/A N/A N/A N/A  H Left lateral mid Benign N/A N/A N/A N/A N/A  I Left lateral apex Atypical small acinar proliferation N/A N/A N/A N/A N/A  J Right lateral base Benign N/A N/A N/A N/A N/A  K Right lateral mid Benign N/A N/A N/A N/A N/A  L Right lateral apex Benign N/A N/A N/A N/A N/A  M ROI #1 Prostatic adenocarcinoma 3 + 4 = 7, GG2 <5% See additional findings. 28 Carcinoma involves 2 of multiple core fragments Carcinoma involves 15% of total aggregate core length.  N ROI #2 Benign N/A N/A N/A N/A N/A  Comment: Cribriform pattern Gleason 4 is not identified.  The ROI #1 biopsy (part M) was also reviewed by Dr. Tresea Mains, who concurs.   I have reviewed the patient symptom surveys (IPSS and IIEF) and these will be uploaded in [facility]. His IPS score is 0.   His IIEF score is 1. Sexual dysfunction treatment None   PSA History-  Lab Results  Component Value Date   TOTALPSA 4.48 (H) 06/30/2023   TOTALPSA 3.25 02/03/2022   TOTALPSA 1.18 12/26/2020      REVIEW OF SYSTEMS:  General ROS: A review of systems was performed and pertinent positives and negatives  were documented in the above HPI; all other systems are negative.    PROBLEM LIST:  Patient Active Problem List  Diagnosis  . Essential hypertension  . Mild cardiomegaly  . 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 Hx pulmonary embolism  . Heavy alcohol consumption  . Hypercholesterolemia  . Prediabetes  . Low vitamin B12 level  . Chronic low back pain  . Aneurysm of ascending aorta without rupture ()  . Anemia  . Vasculogenic erectile dysfunction  . Vitamin D deficiency  . Elevated PSA  . Chronic pain of right knee    Past Medical History:  Past Medical History:  Diagnosis Date  . History of cataract     Past Surgical History:  Past Surgical History:  Procedure Laterality Date  . COLONOSCOPY  06/14/2021   12 mm Tubular adenoma/Repeat 35yrs/CTL   History of Connective Tissue Disorder: No History of Inflammatory Bowel Disease: No History of Prior Radiation Therapy: No Pacemaker: No Hip Replacement: No  Current Medications:  Current Outpatient Medications  Medication Sig Dispense Refill  . amLODIPine  (NORVASC ) 10 MG tablet Take 1 tablet (10 mg total) by mouth once daily 90 tablet 3  . aspirin 81 MG EC tablet Take 1 tablet (81 mg total) by mouth once daily 90 tablet 3  . atenoloL  (TENORMIN ) 25 MG tablet Take 1 tablet (  25 mg total) by mouth once daily 90 tablet 3  . atorvastatin  (LIPITOR) 10 MG tablet Take 1 tablet (10 mg total) by mouth once daily 90 tablet 1  . cholecalciferol (VITAMIN D3) 5,000 unit capsule Take 1 capsule (5,000 Units total) by mouth once daily 360 capsule 11  . cyanocobalamin (VITAMIN B-12) 1000 MCG tablet Take 1 tablet (1,000 mcg total) by mouth once daily 90 tablet 3  . hydroCHLOROthiazide  (HYDRODIURIL ) 25 MG tablet Take 1 tablet (25 mg total) by mouth once daily 90 tablet 3  . sildenafiL (VIAGRA) 100 MG tablet Take one tablet po 60 minutes prior to intercourse 20 tablet 10  . valsartan (DIOVAN) 40 MG tablet Take 1 tablet (40 mg total) by mouth once  daily 90 tablet 3   No current facility-administered medications for this visit.    Allergies:  Allergies  Allergen Reactions  . Lisinopril Swelling    Tongue and mouth swelling   . Penicillins Unknown    Social History:  Social History   Socioeconomic History  . Marital status: Single  Tobacco Use  . Smoking status: Every Day    Current packs/day: 0.50    Average packs/day: 0.5 packs/day for 20.0 years (10.0 ttl pk-yrs)    Types: Cigarettes  Substance and Sexual Activity  . Alcohol use: Never   Social Drivers of Corporate investment banker Strain: Low Risk  (12/04/2023)   Overall Financial Resource Strain (CARDIA)   . Difficulty of Paying Living Expenses: Not hard at all  Food Insecurity: No Food Insecurity (12/04/2023)   Hunger Vital Sign   . Worried About Programme researcher, broadcasting/film/video in the Last Year: Never true   . Ran Out of Food in the Last Year: Never true  Transportation Needs: No Transportation Needs (12/04/2023)   PRAPARE - Transportation   . Lack of Transportation (Medical): No   . Lack of Transportation (Non-Medical): No  Physical Activity: Sufficiently Active (12/04/2023)   Exercise Vital Sign   . Days of Exercise per Week: 5 days   . Minutes of Exercise per Session: 30 min  Stress: Stress Concern Present (12/04/2023)   [employer] of Occupational Health - Occupational Stress Questionnaire   . Feeling of Stress : Very much  Social Connections: Moderately Isolated (12/04/2023)   Social Connection and Isolation Panel   . Frequency of Communication with Friends and Family: More than three times a week   . Frequency of Social Gatherings with Friends and Family: Once a week   . Attends Religious Services: Never   . Active Member of Clubs or Organizations: No   . Attends Banker Meetings: Never   . Marital Status: Married  Housing Stability: Unknown (01/08/2024)   Housing Stability Vital Sign   . Unable to Pay for Housing in the Last Year: No   .  Number of Times Moved in the Last Year: 0   . Homeless in the Last Year: Patient unable to answer    Family History:  Family History  Problem Relation Age of Onset  . High blood pressure (Hypertension) Mother   . Diabetes Mother   . High blood pressure (Hypertension) Father   . Diabetes Maternal Aunt   . Diabetes Maternal Uncle   . Diabetes Paternal Aunt   . Diabetes Paternal Uncle   . High blood pressure (Hypertension) Maternal Grandmother   . Diabetes Maternal Grandmother   . High blood pressure (Hypertension) Maternal Grandfather   . Diabetes Maternal Grandfather   .  High blood pressure (Hypertension) Paternal Grandmother   . Diabetes Paternal Grandmother   . High blood pressure (Hypertension) Paternal Grandfather   . Diabetes Paternal Grandfather     Multiple family members with prostate and breast cancer  Physical Exam Vitals not performed ECOG 0 General: alert, cooperative, in NAD HEENT: no scleral icterus Musculoskeletal: moves all extremities spontaneously, ambulates without difficulty  Extremities: no peripheral edema Neurologic: Grossly normal  Psych: oriented to time, place and person, mood and affect are appropriate DRE not performed Imaging Review: Imaging noted in the History of Present Illness was independently review with agreement with the official radiology read. Assessment:   Diagnosis/Stage: Stage IIB ([id number]), Gleason 3+4 (1/14 cores), PSA-4.48  Tony Graves is a 59 y.o. with favorable intermediate risk prostate cancer.  He presents today to discuss role of radiation in management.    Plan:   I had a thorough discussion with Mr. Windmiller regarding management of his new diagnosis of Favorable Intermediate Risk Prostate Cancer. I have personally reviewed his pathology, imaging and lab reports and primary data where available.  We discussed that standard of care management options for the management of favorable intermediate risk prostate cancer  include radical prostatectomy with risk adapted adjuvant/salvage treatment, external beam radiotherapy alone (SBRT or moderately hypofractionated IMRT), brachytherapy, and active surveillance.  I discussed based the results of the ProtecT trial (NEJM 2023), there is no difference in oncologic or survival outcomes with an radiation versus surgery based approach in this setting after 15 years of follow-up.  We also discussed that the ProtecT Trial also demonstrated no benefit in overall survival or prostate cancer mortality at 15 years with surgery or radiation versus a monitoring based approach in a largely favorable intermediate/low risk patient population thought there was a small (4-5%) metastasis free survival benefit with either treatment. We also briefly discussed the role of focal therapies such as HIFU, nanoknife, and cryoablation in localized favorable intermediate risk prostate cancer.  We discussed the risks and benefits of SBRT, moderately hypofractionated IMRT, and brachytherapy.   Radiation side-effects may include acute and chronic urinary and rectal symptoms (frequency, urgency), mild fatigue, and/or nausea. The risk of delayed or serious complications is low, such as perforation, fistula, radiation proctitis or cystitis, and secondary malignancy.  After discussion, the patient would like to get an opinion regarding the use of focal therapy which I believe is a reasonable option given his low volume pattern 4 disease with all of his positive cores in the ROI biopsies and his desire to minimize the risk of erectile dysfunction.  I will reach out to his urology team to help facilitate this.  He will call us  back if he would like to further discuss the option of SBRT.  Given the patient's smoking history, I counseled the patient on the importance of smoking cessation and recommended he discuss CT lung cancer screening with his PCP.  The patient has multiple family members with breast and prostate  cancer.  I will make a referral to genetic counseling.  My contact info was provided and I encouraged him to reach out with any questions or concerns in the interim.   Tony BELVIE GREEK, MD   Assistant Professor of Radiation Oncology [facility]   01/18/2024 12:47 PM   The MDM is High.   Complexity of Problem-High, disease poses a threat to life or bodily function. Complexity of Data to be reviewed and analyzed-Extensive, review of prior notes, independent history obtained, review of results  of multiple tests and independent interpretation of tests Risk of complications/morbidity-Moderate to High

## 2024-01-20 DIAGNOSIS — C61 Malignant neoplasm of prostate: Secondary | ICD-10-CM | POA: Insufficient documentation

## 2024-01-24 ENCOUNTER — Emergency Department
Admission: EM | Admit: 2024-01-24 | Discharge: 2024-01-24 | Disposition: A | Discharge status: Home / Self Care | Attending: Emergency Medicine | Admitting: Emergency Medicine

## 2024-01-24 ENCOUNTER — Ambulatory Visit (INDEPENDENT_AMBULATORY_CARE_PROVIDER_SITE_OTHER)
Admission: EM | Admit: 2024-01-24 | Discharge: 2024-01-24 | Disposition: A | Discharge status: Home / Self Care | Source: Home / Self Care

## 2024-01-24 ENCOUNTER — Other Ambulatory Visit: Payer: Self-pay

## 2024-01-24 ENCOUNTER — Emergency Department

## 2024-01-24 DIAGNOSIS — N5089 Other specified disorders of the male genital organs: Secondary | ICD-10-CM

## 2024-01-24 DIAGNOSIS — N50819 Testicular pain, unspecified: Secondary | ICD-10-CM

## 2024-01-24 DIAGNOSIS — N433 Hydrocele, unspecified: Secondary | ICD-10-CM | POA: Insufficient documentation

## 2024-01-24 DIAGNOSIS — N451 Epididymitis: Secondary | ICD-10-CM | POA: Diagnosis not present

## 2024-01-24 DIAGNOSIS — I10 Essential (primary) hypertension: Secondary | ICD-10-CM | POA: Diagnosis not present

## 2024-01-24 DIAGNOSIS — Z8546 Personal history of malignant neoplasm of prostate: Secondary | ICD-10-CM | POA: Diagnosis not present

## 2024-01-24 HISTORY — DX: Malignant (primary) neoplasm, unspecified: C80.1

## 2024-01-24 LAB — URINALYSIS, ROUTINE W REFLEX MICROSCOPIC
Bilirubin Urine: NEGATIVE
Glucose, UA: NEGATIVE mg/dL
Hgb urine dipstick: NEGATIVE
Ketones, ur: NEGATIVE mg/dL
Nitrite: NEGATIVE
Protein, ur: NEGATIVE mg/dL
Specific Gravity, Urine: 1.023 (ref 1.005–1.030)
WBC, UA: >50 WBC/hpf (ref 0–5)
pH: 6 (ref 5.0–8.0)

## 2024-01-24 LAB — URINALYSIS, W/ REFLEX TO CULTURE (INFECTION SUSPECTED)
Bilirubin Urine: NEGATIVE
Glucose, UA: NEGATIVE mg/dL
Ketones, ur: NEGATIVE mg/dL
Nitrite: NEGATIVE
Protein, ur: NEGATIVE mg/dL
Specific Gravity, Urine: 1.02 (ref 1.005–1.030)
Squamous Epithelial / HPF: NONE SEEN /HPF (ref 0–5)
pH: 6.5 (ref 5.0–8.0)

## 2024-01-24 LAB — CHLAMYDIA/NGC RT PCR (ARMC ONLY)
Chlamydia Tr: NOT DETECTED
N gonorrhoeae: NOT DETECTED

## 2024-01-24 MED ORDER — DOXYCYCLINE HYCLATE 100 MG PO TABS
100.0000 mg | ORAL_TABLET | Freq: Once | ORAL | Status: AC
  Administered 2024-01-24: 100 mg via ORAL
  Filled 2024-01-24: qty 1

## 2024-01-24 MED ORDER — DOXYCYCLINE HYCLATE 100 MG PO TABS: 100.0000 mg | ORAL_TABLET | Freq: Two times a day (BID) | ORAL | 0 refills | Status: DC

## 2024-01-24 MED ORDER — CEFTRIAXONE SODIUM 1 G IJ SOLR
500.0000 mg | Freq: Once | INTRAMUSCULAR | Status: AC
  Administered 2024-01-24: 500 mg via INTRAMUSCULAR
  Filled 2024-01-24: qty 10

## 2024-01-24 NOTE — Discharge Instructions (Signed)
 Please go to emergency room for further evaluation of your testicular pain and swelling

## 2024-01-24 NOTE — ED Triage Notes (Signed)
 Pt c/o R sided testicle swelling & tenderness x1 day. Denies any trauma. States has been taking more of his Viagra than he should be.

## 2024-01-24 NOTE — ED Provider Notes (Signed)
 [facility] Emergency Department Provider Note     Event Date/Time   First MD Initiated Contact with Patient 01/24/24 1526     (approximate)   History   Groin Swelling   HPI  Tony Graves is a 58 y.o. male with a history of prostate cancer, HTN, HLD, cardiomegaly, and ascending aorta aneurysm, presents to the ED for evaluation of right sided testicular swelling.  Patient denies any recent injury, trauma, or falls.  He denies any dysuria, hematuria, urinary retention or penile discharge.  He denies any frank penile pain or priapism.  He reports there is only mildly tender to touch.  Patient initially presented to a local urgent care for evaluation, and was referred here for further evaluation.  Patient would endorse taking his prescribed Viagra more frequently as of late.  Physical Exam   Triage Vital Signs: ED Triage Vitals  Encounter Vitals Group     BP 01/24/24 1330 122/79     Girls Systolic BP Percentile --      Girls Diastolic BP Percentile --      Boys Systolic BP Percentile --      Boys Diastolic BP Percentile --      Pulse Rate 01/24/24 1330 70     Resp 01/24/24 1330 20     Temp 01/24/24 1330 98.1 F (36.7 C)     Temp Source 01/24/24 1330 Oral     SpO2 01/24/24 1330 99 %     Weight 01/24/24 1329 178 lb 9.2 oz (81 kg)     Height 01/24/24 1329 5' 11 (1.803 m)     Head Circumference --      Peak Flow --      Pain Score 01/24/24 1326 0     Pain Loc --      Pain Education --      Exclude from Growth Chart --     Most recent vital signs: Vitals:   01/24/24 1330 01/24/24 1755  BP: 122/79 (!) 143/78  Pulse: 70 70  Resp: 20 16  Temp: 98.1 F (36.7 C) 98.4 F (36.9 C)  SpO2: 99% 100%    General Awake, no distress. NAD HEENT NCAT. PERRL. EOMI. No rhinorrhea. Mucous membranes are moist.  CV:  Good peripheral perfusion. RRR RESP:  Normal effort. CTA ABD:  No distention.  GU:  deferred   ED Results / Procedures / Treatments    Labs (all labs ordered are listed, but only abnormal results are displayed) Labs Reviewed  URINALYSIS, ROUTINE W REFLEX MICROSCOPIC - Abnormal; Notable for the following components:      Result Value   Color, Urine YELLOW (*)    APPearance CLEAR (*)    Leukocytes,Ua MODERATE (*)    Bacteria, UA RARE (*)    Crystals PRESENT (*)    All other components within normal limits  CHLAMYDIA/NGC RT PCR ([facility] ONLY)              EKG   RADIOLOGY  I personally viewed and evaluated these images as part of my medical decision making, as well as reviewing the written report by the radiologist.  ED Provider Interpretation: Right-sided epididymitis and hydrocele  US  SCROTUM W/DOPPLER Result Date: 01/24/2024 CLINICAL DATA:  712118 Pain in right testicle [id number] Testicle swelling [id number] EXAM: SCROTAL ULTRASOUND DOPPLER ULTRASOUND OF THE TESTICLES TECHNIQUE: Complete ultrasound examination of the testicles, epididymis, and other scrotal structures was performed. Color and spectral Doppler ultrasound were also utilized to evaluate blood  flow to the testicles. COMPARISON:  None Available. FINDINGS: Right testicle Measurements: 3.5 x 2.3 x 2.8 cm. No mass or microlithiasis visualized. Left testicle Measurements: 3.8 x 1.9 x 2.3 cm. No mass or microlithiasis visualized. Right epididymis: Edematous and enlarged with increased vascular Doppler flow. Left epididymis:  Normal in size and appearance. Hydrocele:  Moderate-sized right hydrocele Varicocele:  None visualized. Pulsed Doppler interrogation of both testes demonstrates normal low resistance arterial and venous waveforms bilaterally. IMPRESSION: 1. Right-sided epididymitis with a moderate volume right hydrocele, likely reactive. 2. No testicular mass or findings of testicular torsion, at this time. Electronically Signed   By: Rogelia Myers M.D.   On: 01/24/2024 17:09    PROCEDURES:  Critical Care performed: No  Procedures   MEDICATIONS ORDERED  IN ED: Medications  cefTRIAXone  (ROCEPHIN ) injection 500 mg (500 mg Intramuscular Given 01/24/24 1744)  doxycycline  (VIBRA -TABS) tablet 100 mg (100 mg Oral Given 01/24/24 1744)     IMPRESSION / MDM / ASSESSMENT AND PLAN / ED COURSE  I reviewed the triage vital signs and the nursing notes.                              Differential diagnosis includes, but is not limited to, epididymitis, hydrocele, varicocele, torsion  Patient's presentation is most consistent with acute complicated illness / injury requiring diagnostic workup.  Patient's diagnosis is consistent with epididymitis and hydrocele on the right.  Patient presents from local urgent care for evaluation of right sided scrotal fullness and discomfort.  Labs are reassuring and UA shows some crystals and WBCs with rare bacteria.  Patient with a pending urine culture from the urgent care noted on chart.  Patient treated empirically with an IM dose of Rocephin  and an oral dose of doxycycline .  Patient will be discharged home with prescriptions for doxycycline . Patient is to follow up with his PCP or urology as suggested, as needed or otherwise directed. Patient is given ED precautions to return to the ED for any worsening or new symptoms.   FINAL CLINICAL IMPRESSION(S) / ED DIAGNOSES   Final diagnoses:  Epididymitis  Hydrocele, unspecified hydrocele type     Rx / DC Orders   ED Discharge Orders          Ordered    doxycycline  (VIBRA -TABS) 100 MG tablet  2 times daily        01/24/24 1727             Note:  This document was prepared using Dragon voice recognition software and may include unintentional dictation errors.    Loyd Candida LULLA Aldona, PA-C 01/24/24 2333    Levander Slate, MD 01/25/24 [phone]

## 2024-01-24 NOTE — Discharge Instructions (Addendum)
 You have been treated for a hydrocele and epididymitis causing swelling to your scrotum with 2 different antibiotics.  Continue with the tablets as directed until all pills are completed.  Follow-up with urgent care related to the pending urine culture.  Follow-up with your primary provider or urologist as discussed.

## 2024-01-24 NOTE — ED Triage Notes (Signed)
 Pt to ED for R testicle swelling since 3-4 days ago. Only painful to touch. Early stage prostate cancer, no tx yet. Denies dysuria.  Urine sent to lab.

## 2024-01-24 NOTE — ED Notes (Signed)
 Patient is being discharged from the Urgent Care and sent to the [facility] Emergency Department via private vehicle . Per Myla Bold, PA, patient is in need of higher level of care due to swelling in [place] and DX Prostate Cancer. Patient is aware and verbalizes understanding of plan of care.  Vitals:   01/24/24 1205  BP: 122/81  Pulse: 70  Resp: 16  Temp: 98.9 F (37.2 C)  SpO2: 96%

## 2024-01-24 NOTE — ED Provider Notes (Signed)
 [facility]    CSN: [id number] Arrival date & time: 01/24/24  1159      History   Chief Complaint Chief Complaint  Patient presents with   Groin Swelling    HPI Tony Graves is a 58 y.o. male with past medical history of prostate cancer, hyperlipidemia, PE, hypertension, prediabetes presents for testicular pain.  Patient reports 1 day of right sided testicular pain and swelling.  He denies any trauma to the area, dysuria, penile discharge, fevers or chills.  He does state he has been taking more of his Viagra than normal but denies any penile pain or priapism.  He states he is not currently undergoing treatment for his prostate cancer as he is awaiting a second opinion. Denies any STD concern or exposure.  Denies any other concerns at this time.  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 Authorizing Provider  atorvastatin  (LIPITOR) 10 MG tablet Take 10 mg by mouth daily. 12/04/23  Yes [provider]  colchicine 0.6  MG tablet Take 0.6 mg by mouth 2 (two) times daily. 12/04/23  Yes [provider]  cyanocobalamin (VITAMIN B12) 1000 MCG tablet Take 1 tablet (1,000 mcg total) by mouth once daily 12/04/23  Yes [provider]  sildenafil (VIAGRA) 100 MG tablet Take one tablet po 60 minutes prior to intercourse 01/08/24  Yes [provider]  amLODipine  (NORVASC ) 10 MG tablet Take 10 mg by mouth daily. 08/03/23   [provider]  aspirin EC 81 MG tablet Take 81 mg by mouth daily. Swallow whole.    [provider]  atenolol  (TENORMIN ) 25 MG tablet Take 1 tablet (25 mg total) by mouth daily. 03/20/20   Lamptey, Aleene KIDD, MD  atorvastatin  (LIPITOR) 10 MG tablet Take 1 tablet (10 mg total) by mouth daily. 10/31/20   Cook, Jayce G, DO  Cholecalciferol (VITAMIN D3) 125 MCG (5000 UT) CAPS Take 1 capsule by mouth daily.    [provider]  hydrochlorothiazide  (HYDRODIURIL ) 25 MG tablet Take 1 tablet (25 mg total) by mouth daily. 10/31/20   Cook, Jayce G, DO  ketorolac  (ACULAR ) 0.5 % ophthalmic solution Place 1 drop into both eyes every 6 (six) hours. 12/13/22   Bernardino Ditch, NP  ondansetron  (ZOFRAN -ODT) 4 MG disintegrating tablet Take 1 tablet (4 mg total) by mouth every 8 (eight) hours as needed for nausea or vomiting. 11/17/23   Suzanne Kirsch, MD  tamsulosin  (FLOMAX ) 0.4 MG CAPS capsule TAKE 1 CAPSULE(0.4 MG) BY MOUTH  DAILY 08/31/23   Stoioff, Glendia BROCKS, MD  valsartan (DIOVAN) 40 MG tablet Take 40 mg by mouth daily.    [provider]    Family History Family History  Problem Relation Age of Onset   Hypertension Mother    Stroke Mother    Cancer Father     Social History Social History   Tobacco Use   Smoking status: Every Day    Current packs/day: 0.75    Types: Cigarettes   Smokeless tobacco: Never  Vaping Use   Vaping status: Never Used  Substance Use Topics   Alcohol use: Yes    Comment: 2-3 days weekly   Drug use: Never     Allergies    Lisinopril and Penicillins   Review of Systems Review of Systems  Genitourinary:  Positive for testicular pain.     Physical Exam Triage Vital Signs ED Triage Vitals  Encounter Vitals Group     BP 01/24/24 1205 122/81     Girls Systolic BP Percentile --      Girls Diastolic BP Percentile --      Boys Systolic BP Percentile --      Boys Diastolic BP Percentile --      Pulse Rate 01/24/24 1205 70     Resp 01/24/24 1205 16     Temp 01/24/24 1205 98.9 F (37.2 C)     Temp Source 01/24/24 1205 Oral     SpO2 01/24/24 1205 96 %     Weight 01/24/24 1204 180 lb (81.6 kg)     Height 01/24/24 1204 5' 11 (1.803 m)     Head Circumference --      Peak Flow --      Pain Score 01/24/24 1209 8     Pain Loc --      Pain Education --      Exclude from Growth Chart --    No data found.  Updated Vital Signs BP 122/81 (BP Location: Left Arm)   Pulse 70   Temp 98.9 F (37.2 C) (Oral)   Resp 16   Ht 5' 11 (1.803 m)   Wt 180 lb (81.6 kg)   SpO2 96%   BMI 25.10 kg/m   Visual Acuity Right Eye Distance:   Left Eye Distance:   Bilateral Distance:    Right Eye Near:   Left Eye Near:    Bilateral Near:     Physical Exam Vitals and nursing note reviewed. Exam conducted with a chaperone present Bonney O'dell CMA).  Constitutional:      General: He is not in acute distress.    Appearance: Normal appearance. He is not ill-appearing.  HENT:     Head: Normocephalic and atraumatic.  Eyes:     Pupils: Pupils are equal, round, and reactive to light.  Cardiovascular:     Rate and Rhythm: Normal rate.  Pulmonary:     Effort: Pulmonary effort is normal.  Genitourinary:    Penis: Circumcised.      Testes:        Right: Swelling present. Right testis is descended. Cremasteric reflex is present.      Epididymis:     Right: No mass or tenderness.     Comments: Moderate right testicular swelling, no priapism  Skin:    General: Skin is warm and dry.  Neurological:     General: No  focal deficit present.     Mental Status: He is alert and oriented to person, place, and time.  Psychiatric:        Mood and Affect: Mood normal.        Behavior: Behavior normal.      UC Treatments / Results  Labs (all labs ordered are listed, but only abnormal results are displayed) Labs Reviewed  URINALYSIS, W/ REFLEX TO CULTURE (INFECTION SUSPECTED) - Abnormal; Notable for the following components:      Result Value   Hgb urine dipstick TRACE (*)    Leukocytes,Ua SMALL (*)    Bacteria, UA MANY (*)    All other components within normal limits  URINE CULTURE    EKG   Radiology No results found.  Procedures Procedures (including critical care time)  Medications Ordered in UC Medications - No data to display  Initial Impression / Assessment and Plan / UC Course  I have reviewed the triage vital signs and the nursing notes.  Pertinent labs & imaging results that were available during my care of the patient were reviewed by me and considered in my medical decision making (see chart for details).     Reviewed exam and sx with patient. Swollen right testicle without erythema or warmth. No tenderness. Ua shows possible UTI, pt denies sx. He denies STD concern. Pt concerned it is from overuse of viagra. Advised it is not a known s/e of that medications. Pt states sx worsening, discussed treatment for UTI and f/u with PCP asap for US  vs ER evaluation today. Pt opts for ER evaluation and will go POV to the ER.  Final Clinical Impressions(s) / UC Diagnoses   Final diagnoses:  Pain in testicle, unspecified laterality  Testicular swelling     Discharge Instructions      Please go to emergency room for further evaluation of your testicular pain and swelling     ED Prescriptions   None    PDMP not reviewed this encounter.   Loreda Myla SAUNDERS, NP 01/24/24 1249

## 2024-01-27 ENCOUNTER — Ambulatory Visit (HOSPITAL_COMMUNITY): Payer: Self-pay

## 2024-02-01 ENCOUNTER — Telehealth: Payer: Self-pay

## 2024-02-01 NOTE — Telephone Encounter (Signed)
 Pt called with c/o bil testicles swollen. Pt states they were swollen since Friday. He went to urgent care and they stated he might have an infection and he needed at ultrasound. Pt requests appointment with Dr. Twylla as he is seeing him for prostrate cancer. Booked for 8/19 with Dr. Twylla

## 2024-02-02 ENCOUNTER — Ambulatory Visit: Admitting: Urology

## 2024-02-05 LAB — URINE CULTURE: Culture: 40000 — AB

## 2024-02-07 NOTE — Progress Notes (Unsigned)
 02/08/2024 8:49 AM   Legrand Mins December 26, 1965 [id number]  Referring provider: Steva Clotilda DEL, NP [address] [place],  [place] [address]  Urological history: 1.     No chief complaint on file.   HPI: Tony Graves is a 58 y.o. man who presents today for Emergency Room follow up.    Previous records reviewed.   He was seen at [facility] on 01/24/2024 for right testicular pain and then advised to seek further treatment in the ED.  His UA was positive for 6-10 RBC's, > 50 WBC's, mucus and crystals.  Urine culture was positive for E. Coli.   He was given 1 gram Rocephin  IM and  Doxycycline  100 mg BID x 10 days.  Chlamydia/gonorrhea PCR's negative.   Scrotal US  demonstrated right epididymitis with moderate volume right hydrocele.        reports that he has been smoking cigarettes. He has never used smokeless tobacco. He reports current alcohol use. He reports that he does not use drugs.  Lab Results  Component Value Date   WBC 12.7 (H) 11/17/2023   HGB 14.1 11/17/2023   HCT 41.1 11/17/2023   MCV 90.7 11/17/2023   PLT 265 11/17/2023    Lab Results  Component Value Date   CREATININE 1.17 11/17/2023    No results found for: PSA  No results found for: TESTOSTERONE  No results found for: HGBA1C  Urinalysis    Component Value Date/Time   COLORURINE YELLOW (A) 01/24/2024 1330   APPEARANCEUR CLEAR (A) 01/24/2024 1330   LABSPEC 1.023 01/24/2024 1330   PHURINE 6.0 01/24/2024 1330   GLUCOSEU NEGATIVE 01/24/2024 1330   HGBUR NEGATIVE 01/24/2024 1330   BILIRUBINUR NEGATIVE 01/24/2024 1330   KETONESUR NEGATIVE 01/24/2024 1330   PROTEINUR NEGATIVE 01/24/2024 1330   NITRITE NEGATIVE 01/24/2024 1330   LEUKOCYTESUR MODERATE (A) 01/24/2024 1330    Lab Results  Component Value Date   BACTERIA RARE (A) 01/24/2024    PMH: Past Medical History:  Diagnosis Date   Cancer (HCC)    prostate, early   High cholesterol    History of pulmonary  embolism    Hypertension    Mild cardiomegaly    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 If you have any questions, ask your nurse or doctor.          amLODipine  10 MG tablet Commonly known as: NORVASC  Take 10 mg by mouth daily.   aspirin EC 81 MG tablet Take 81 mg by mouth daily. Swallow whole.   atenolol  25 MG tablet Commonly known as: TENORMIN  Take 1 tablet (25 mg total) by mouth daily.   atorvastatin  10 MG tablet Commonly known as: LIPITOR Take 1 tablet (10 mg total) by mouth daily.   atorvastatin  10 MG tablet Commonly known as: LIPITOR Take 10 mg by mouth daily.   colchicine 0.6 MG tablet Take 0.6 mg by mouth 2 (two) times daily.   cyanocobalamin 1000 MCG tablet Commonly  known as: VITAMIN B12 Take 1 tablet (1,000 mcg total) by mouth once daily   doxycycline  100 MG tablet Commonly known as: VIBRA -TABS Take 1 tablet (100 mg total) by mouth 2 (two) times daily.   hydrochlorothiazide  25 MG tablet Commonly known as: HYDRODIURIL  Take 1 tablet (25 mg total) by mouth daily.   ketorolac  0.5 % ophthalmic solution Commonly known as: ACULAR  Place 1 drop into both eyes every 6 (six) hours.   ondansetron  4 MG disintegrating tablet Commonly known as: ZOFRAN -ODT Take 1 tablet (4 mg total) by mouth every 8 (eight) hours as needed for nausea or vomiting.   sildenafil 100 MG tablet Commonly known as: VIAGRA Take one tablet po 60 minutes prior to intercourse   tamsulosin  0.4 MG Caps capsule Commonly known as: FLOMAX  TAKE 1 CAPSULE(0.4  MG) BY MOUTH DAILY   valsartan 40 MG tablet Commonly known as: DIOVAN Take 40 mg by mouth daily.   Vitamin D3 125 MCG (5000 UT) Caps Take 1 capsule by mouth daily.        Allergies:  Allergies  Allergen Reactions   Lisinopril Swelling   Penicillins     Family History: Family History  Problem Relation Age of Onset   Hypertension Mother    Stroke Mother    Cancer Father     Social History: See HPI for pertinent social history  ROS: Pertinent ROS in HPI  Physical Exam: There were no vitals taken for this visit.  Constitutional:  Well nourished. Alert and oriented, No acute distress. HEENT:  AT, moist mucus membranes.  Trachea midline, no masses. Cardiovascular: No clubbing, cyanosis, or edema. Respiratory: Normal respiratory effort, no increased work of breathing. GI: Abdomen is soft, non tender, non distended, no abdominal masses. Liver and spleen not palpable.  No hernias appreciated.  Stool sample for occult testing is not indicated.   GU: No CVA tenderness.  No bladder fullness or masses.  Patient with circumcised/uncircumcised phallus. ***Foreskin easily retracted***  Urethral meatus is patent.  No penile discharge. No penile lesions or rashes. Scrotum without lesions, cysts, rashes and/or edema.  Testicles are located scrotally bilaterally. No masses are appreciated in the testicles. Left and right epididymis are normal. Rectal: Patient with  normal sphincter tone. Anus and perineum without scarring or rashes. No rectal masses are appreciated. Prostate is approximately *** grams, *** nodules are appreciated. Seminal vesicles are normal. Skin: No rashes, bruises or suspicious lesions. Lymph: No cervical or inguinal adenopathy. Neurologic: Grossly intact, no focal deficits, moving all 4 extremities. Psychiatric: Normal mood and affect.  Laboratory Data: See EPIC and HPI  I have reviewed the labs.   Pertinent Imaging: CLINICAL DATA:  [id number] Pain in right  testicle [id number] Testicle swelling [id number]   EXAM: SCROTAL ULTRASOUND   DOPPLER ULTRASOUND OF THE TESTICLES   TECHNIQUE: Complete ultrasound examination of the testicles, epididymis, and other scrotal structures was performed. Color and spectral Doppler ultrasound were also utilized to evaluate blood flow to the testicles.   COMPARISON:  None Available.   FINDINGS: Right testicle   Measurements: 3.5 x 2.3 x 2.8 cm. No mass or microlithiasis visualized.   Left testicle   Measurements: 3.8 x 1.9 x 2.3 cm. No mass or microlithiasis visualized.   Right epididymis: Edematous and enlarged with increased vascular Doppler flow.   Left epididymis:  Normal in size and appearance.   Hydrocele:  Moderate-sized right hydrocele   Varicocele:  None visualized.   Pulsed Doppler interrogation of both testes demonstrates normal low  resistance arterial and venous waveforms bilaterally.   IMPRESSION: 1. Right-sided epididymitis with a moderate volume right hydrocele, likely reactive. 2. No testicular mass or findings of testicular torsion, at this time.     Electronically Signed   By: Rogelia Myers M.D.   On: 01/24/2024 17:09 I have independently reviewed the films.  See HPI.    Assessment & Plan:  ***  1. Right epididymitis -scrotal US  perfomed in the ED show findings concerning for right epididymitis and a likely reactive hydrocele -*** -UA ***  2. Prostate cancer -patient sought second opinion at [facility] and will be receiving his care there  3.  ED - continue sildenafil 100 mg, on-demand-dosing through [facility]   No follow-ups on file.  These notes generated with voice recognition software. I apologize for typographical errors.  CLOTILDA HELON RIGGERS  [facility] [address]  [address] [place], [place] [address] [phone]

## 2024-02-08 ENCOUNTER — Encounter: Payer: Self-pay | Admitting: Urology

## 2024-02-08 ENCOUNTER — Ambulatory Visit: Payer: Self-pay | Admitting: Urology

## 2024-02-08 ENCOUNTER — Other Ambulatory Visit
Admission: RE | Admit: 2024-02-08 | Discharge: 2024-02-08 | Disposition: A | Discharge status: Home / Self Care | Attending: Urology | Admitting: Urology

## 2024-02-08 ENCOUNTER — Ambulatory Visit (INDEPENDENT_AMBULATORY_CARE_PROVIDER_SITE_OTHER): Admitting: Urology

## 2024-02-08 VITALS — BP 129/72 | HR 61 | Ht 71.0 in | Wt 189.0 lb

## 2024-02-08 DIAGNOSIS — C61 Malignant neoplasm of prostate: Secondary | ICD-10-CM | POA: Diagnosis not present

## 2024-02-08 DIAGNOSIS — R3129 Other microscopic hematuria: Secondary | ICD-10-CM | POA: Diagnosis not present

## 2024-02-08 DIAGNOSIS — N451 Epididymitis: Secondary | ICD-10-CM | POA: Insufficient documentation

## 2024-02-08 DIAGNOSIS — N529 Male erectile dysfunction, unspecified: Secondary | ICD-10-CM

## 2024-02-08 LAB — URINALYSIS, COMPLETE (UACMP) WITH MICROSCOPIC
Bilirubin Urine: NEGATIVE
Glucose, UA: NEGATIVE mg/dL
Ketones, ur: NEGATIVE mg/dL
Leukocytes,Ua: NEGATIVE
Nitrite: NEGATIVE
Protein, ur: NEGATIVE mg/dL
Specific Gravity, Urine: 1.02 (ref 1.005–1.030)
Squamous Epithelial / HPF: NONE SEEN /HPF (ref 0–5)
pH: 7 (ref 5.0–8.0)

## 2024-02-10 NOTE — Telephone Encounter (Signed)
 Pts wifes called and let her know UA showed no signs of infection. Was able to go ahead and schedule a 3 mth F/U

## 2024-03-15 ENCOUNTER — Ambulatory Visit
Admission: EM | Admit: 2024-03-15 | Discharge: 2024-03-15 | Disposition: A | Discharge status: Home / Self Care | Attending: Emergency Medicine | Admitting: Emergency Medicine

## 2024-03-15 ENCOUNTER — Encounter: Payer: Self-pay | Admitting: Emergency Medicine

## 2024-03-15 DIAGNOSIS — R202 Paresthesia of skin: Secondary | ICD-10-CM | POA: Insufficient documentation

## 2024-03-15 DIAGNOSIS — G5601 Carpal tunnel syndrome, right upper limb: Secondary | ICD-10-CM | POA: Diagnosis present

## 2024-03-15 DIAGNOSIS — R7303 Prediabetes: Secondary | ICD-10-CM | POA: Diagnosis not present

## 2024-03-15 DIAGNOSIS — Z9181 History of falling: Secondary | ICD-10-CM | POA: Insufficient documentation

## 2024-03-15 DIAGNOSIS — Z79899 Other long term (current) drug therapy: Secondary | ICD-10-CM | POA: Insufficient documentation

## 2024-03-15 LAB — HEMOGLOBIN A1C
Hgb A1c MFr Bld: 5.6 % (ref 4.8–5.6)
Mean Plasma Glucose: 114.02 mg/dL

## 2024-03-15 LAB — COMPREHENSIVE METABOLIC PANEL WITH GFR
ALT: 25 U/L (ref 0–44)
AST: 33 U/L (ref 15–41)
Albumin: 4.4 g/dL (ref 3.5–5.0)
Alkaline Phosphatase: 73 U/L (ref 38–126)
Anion gap: 12 (ref 5–15)
BUN: 16 mg/dL (ref 6–20)
CO2: 26 mmol/L (ref 22–32)
Calcium: 9.4 mg/dL (ref 8.9–10.3)
Chloride: 99 mmol/L (ref 98–111)
Creatinine, Ser: 1.06 mg/dL (ref 0.61–1.24)
GFR, Estimated: >60 mL/min (ref >60)
Glucose, Bld: 85 mg/dL (ref 70–99)
Potassium: 3.8 mmol/L (ref 3.5–5.1)
Sodium: 137 mmol/L (ref 135–145)
Total Bilirubin: 0.7 mg/dL (ref 0.0–1.2)
Total Protein: 8.5 g/dL — ABNORMAL HIGH (ref 6.5–8.1)

## 2024-03-15 LAB — CBC WITH DIFFERENTIAL/PLATELET
Abs Immature Granulocytes: 0.02 K/uL (ref 0.00–0.07)
Basophils Absolute: 0 K/uL (ref 0.0–0.1)
Basophils Relative: 1 %
Eosinophils Absolute: 0.2 K/uL (ref 0.0–0.5)
Eosinophils Relative: 3 %
HCT: 40.7 % (ref 39.0–52.0)
Hemoglobin: 14.3 g/dL (ref 13.0–17.0)
Immature Granulocytes: 0 %
Lymphocytes Relative: 38 %
Lymphs Abs: 2.4 K/uL (ref 0.7–4.0)
MCH: 31 pg (ref 26.0–34.0)
MCHC: 35.1 g/dL (ref 30.0–36.0)
MCV: 88.1 fL (ref 80.0–100.0)
Monocytes Absolute: 0.4 K/uL (ref 0.1–1.0)
Monocytes Relative: 6 %
Neutro Abs: 3.4 K/uL (ref 1.7–7.7)
Neutrophils Relative %: 52 %
Platelets: 281 K/uL (ref 150–400)
RBC: 4.62 MIL/uL (ref 4.22–5.81)
RDW: 16 % — ABNORMAL HIGH (ref 11.5–15.5)
WBC: 6.4 K/uL (ref 4.0–10.5)
nRBC: 0 % (ref 0.0–0.2)

## 2024-03-15 MED ORDER — PREDNISONE 10 MG (21) PO TBPK: ORAL_TABLET | ORAL | 0 refills | Status: AC

## 2024-03-15 NOTE — ED Triage Notes (Signed)
 Pt presents with right hand and right leg numbness and tingling x 2 days.

## 2024-03-15 NOTE — ED Provider Notes (Addendum)
 [facility]    CSN: [id number] Arrival date & time: 03/15/24  9182      History   Chief Complaint Chief Complaint  Patient presents with   Numbness    HPI Tony Graves is a 58 y.o. male.   HPI  58 year old male with past medical history significant for mild cardiomegaly, hypertension, PE, high cholesterol, early prostate cancer, history of heavy alcohol consumption, and ascending aortic aneurysm presents for evaluation of numbness to the right hand and numbness to the right foot for the past 2 days.  He also reports that sometimes when he closes his hand it will cause it to lock up.  He does report that he had a fall 3 days ago and struck his head but did not have a loss of consciousness.  He denies headache, change in vision, nausea, vomiting, weakness in his extremities, loss of bowel or bladder control, or saddle anesthesia.  He does have a history of prediabetes as well.  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     Patient Active Problem List   Diagnosis Date Noted   Prostate cancer (HCC) 01/20/2024   Aneurysm of ascending aorta without rupture 09/19/2021   Chronic low back pain 01/14/2021   Low vitamin B12 level 01/01/2021   Prediabetes 01/01/2021   Heavy alcohol consumption 12/28/2020   Hypercholesterolemia 12/28/2020   Essential hypertension 12/26/2020   Hx pulmonary embolism 12/26/2020   Mild cardiomegaly 12/26/2020   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 Authorizing Provider  predniSONE  (STERAPRED UNI-PAK 21 TAB) 10 MG (21) TBPK tablet Take 6 tablets on day 1, 5 tablets day 2, 4 tablets day 3, 3 tablets day 4, 2 tablets day 5, 1 tablet day 6 03/15/24  Yes Bernardino Ditch, NP  amLODipine  (NORVASC ) 10 MG tablet Take 10 mg by mouth daily. 08/03/23   [provider]  aspirin EC 81 MG tablet Take 81 mg by mouth daily. Swallow whole.    [provider]  atenolol  (TENORMIN ) 25 MG tablet Take 1 tablet (25 mg total) by mouth daily. 03/20/20   Lamptey, Aleene KIDD, MD  atorvastatin  (LIPITOR) 10 MG tablet Take 1 tablet (10 mg total) by mouth daily. 10/31/20   Cook, Jayce G, DO  atorvastatin  (LIPITOR) 10 MG tablet Take 10 mg by mouth daily. 12/04/23   [provider]  Cholecalciferol (VITAMIN D3) 125 MCG (5000 UT) CAPS Take 1 capsule by mouth daily.    [provider]  colchicine 0.6 MG tablet Take 0.6 mg by mouth 2 (two) times daily. 12/04/23   [provider]  cyanocobalamin (VITAMIN B12) 1000 MCG tablet Take 1 tablet (1,000 mcg total) by mouth once daily 12/04/23   [provider]  hydrochlorothiazide  (HYDRODIURIL ) 25 MG tablet Take 1 tablet (25 mg total) by mouth daily. 10/31/20   Cook, Jayce G, DO  ketorolac  (ACULAR ) 0.5 % ophthalmic solution Place 1 drop into both  eyes every 6 (six) hours. 12/13/22   Bernardino Ditch, NP  ondansetron  (ZOFRAN -ODT) 4 MG disintegrating tablet Take 1 tablet (4 mg total) by mouth every 8 (eight) hours as needed for nausea or vomiting. 11/17/23   Suzanne Kirsch, MD  sildenafil (VIAGRA) 100 MG tablet Take one tablet po 60 minutes prior to intercourse 01/08/24   [provider]  tamsulosin  (FLOMAX ) 0.4 MG CAPS capsule TAKE 1 CAPSULE(0.4 MG) BY MOUTH DAILY 08/31/23   Stoioff, Glendia BROCKS, MD  valsartan (DIOVAN) 40 MG tablet Take 40 mg by mouth daily.    [provider]    Family History Family History  Problem Relation Age of Onset   Hypertension Mother     Stroke Mother    Cancer Father     Social History Social History   Tobacco Use   Smoking status: Every Day    Current packs/day: 0.75    Types: Cigarettes   Smokeless tobacco: Never  Vaping Use   Vaping status: Never Used  Substance Use Topics   Alcohol use: Yes    Comment: 2-3 days weekly   Drug use: Never     Allergies   Lisinopril and Penicillins   Review of Systems Review of Systems  Eyes:  Negative for visual disturbance.  Respiratory:  Negative for shortness of breath.   Cardiovascular:  Negative for chest pain.  Gastrointestinal:  Negative for nausea and vomiting.  Genitourinary:  Negative for difficulty urinating.  Musculoskeletal:  Negative for back pain and neck pain.  Neurological:  Positive for numbness. Negative for dizziness, facial asymmetry, weakness and headaches.     Physical Exam Triage Vital Signs ED Triage Vitals  Encounter Vitals Group     BP      Girls Systolic BP Percentile      Girls Diastolic BP Percentile      Boys Systolic BP Percentile      Boys Diastolic BP Percentile      Pulse      Resp      Temp      Temp src      SpO2      Weight      Height      Head Circumference      Peak Flow      Pain Score      Pain Loc      Pain Education      Exclude from Growth Chart    No data found.  Updated Vital Signs BP 128/76 (BP Location: Right Arm)   Pulse 62   Temp 98.4 F (36.9 C) (Oral)   Resp 18   SpO2 96%   Visual Acuity Right Eye Distance:   Left Eye Distance:   Bilateral Distance:    Right Eye Near:   Left Eye Near:    Bilateral Near:     Physical Exam Vitals and nursing note reviewed.  Constitutional:      Appearance: Normal appearance. He is not ill-appearing.  HENT:     Head: Normocephalic and atraumatic.     Mouth/Throat:     Mouth: Mucous membranes are moist.     Pharynx: Oropharynx is clear. No oropharyngeal exudate or posterior oropharyngeal erythema.  Skin:    General: Skin is warm and dry.      Capillary Refill: Capillary refill takes less than 2 seconds.     Findings: No erythema or rash.  Neurological:     General: No focal deficit present.     Mental  Status: He is alert and oriented to person, place, and time.     Cranial Nerves: No cranial nerve deficit.     Sensory: Sensory deficit present.     Motor: No weakness.     Coordination: Coordination normal.     Gait: Gait normal.     Deep Tendon Reflexes: Reflexes normal.      UC Treatments / Results  Labs (all labs ordered are listed, but only abnormal results are displayed) Labs Reviewed  COMPREHENSIVE METABOLIC PANEL WITH GFR - Abnormal; Notable for the following components:      Result Value   Total Protein 8.5 (*)    All other components within normal limits  CBC WITH DIFFERENTIAL/PLATELET - Abnormal; Notable for the following components:   RDW 16.0 (*)    All other components within normal limits  CBC WITH DIFFERENTIAL/PLATELET  HEMOGLOBIN A1C    EKG   Radiology No results found.  Procedures Procedures (including critical care time)  Medications Ordered in UC Medications - No data to display  Initial Impression / Assessment and Plan / UC Course  I have reviewed the triage vital signs and the nursing notes.  Pertinent labs & imaging results that were available during my care of the patient were reviewed by me and considered in my medical decision making (see chart for details).   Patient is a pleasant, nontoxic-appearing 58 year old male presenting for evaluation of numbness in the right hand and numbness in the right foot for the past 2 days.  He reports that the numbness is constant and he will occasionally have numbness in his left hand as well.  Also he will experience his right hand locking up on him when he is using it.  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  He is not having any headache and he denies any  weakness, saddle anesthesia, or fecal or urinary incontinence.  On exam cranial nerves II through XII are intact.  Pupils equal and reactive and EOM's intact.  Bilateral grips, upper extremity strength, lower extremity strength are all normal.  Patient has a normal finger-nose and heel-to-shin.  He does have decreased sensation to the medial aspect of his right palm as well as to his right index, middle, and ring finger.  The pinky and thumb have normal sensation.  Positive Tinel's sign and positive Phalen's test.  I suspect that the patient has carpal tunnel syndrome which is contributing to the numbness in his hands.  He does have a history of prediabetes but also reports that there are times where he will feel off and shaky and if he does not eat something sweet he will experience syncope suggesting possible hypoglycemia.  Though, given his history of prediabetes and vasculopathy there is concern for neuropathy in his feet.  I will obtain a CBC, CMP, and hemoglobin A1c.  CMP shows normal sodium, potassium, chloride, renal function, and transaminases.  Mild elevation of total protein at 8.5.  CBC is unremarkable.  Hemoglobin A1c 5.6.  I will discharge patient home with a diagnosis of carpal tunnel syndrome and paresthesias in his right foot and we will treat him with a steroid taper.  Also a wrist brace and home physical therapy exercises for his wrist.  If symptoms not proved he should follow-up with orthopedics.   Final Clinical Impressions(s) / UC Diagnoses   Final diagnoses:  Paresthesia  Carpal tunnel syndrome of right wrist     Discharge Instructions      Wear the wrist brace at night to keep the wrist in neutral position and help alleviate the tingling and numbness.  Starting small morning at breakfast take the prednisone  according to the package instructions.  He will take this for period of 6 days.  Once you have finished the prednisone  take over-the-counter nonsteroidal  anti-inflammatory medication such as ibuprofen  or naproxen  according to the package instructions.  Perform the exercises given in your discharge packet to help strengthen your forearm and stretch the muscles and tendons to help improve your symptoms.  Rest your hand and wrist is much as possible during the day.  You can apply ice to your wrist for 20 minutes at a time 2-3 times a day to help with pain and swelling.  If your symptoms did not improve, or they worsen, you need to follow-up with orthopedics for additional treatment options or surgery.   If you develop any headache, changes in vision, or weakness in any of your extremities you need to call 911 and go to the ER.     ED Prescriptions     Medication Sig Dispense Auth. Provider   predniSONE  (STERAPRED UNI-PAK 21 TAB) 10 MG (21) TBPK tablet Take 6 tablets on day 1, 5 tablets day 2, 4 tablets day 3, 3 tablets day 4, 2 tablets day 5, 1 tablet day 6 21 tablet Bernardino Ditch, NP      PDMP not reviewed this encounter.   Bernardino Ditch, NP 03/15/24 1053    Bernardino Ditch, NP 03/15/24 [phone]

## 2024-03-15 NOTE — Discharge Instructions (Addendum)
 Wear the wrist brace at night to keep the wrist in neutral position and help alleviate the tingling and numbness.  Starting small morning at breakfast take the prednisone  according to the package instructions.  He will take this for period of 6 days.  Once you have finished the prednisone  take over-the-counter nonsteroidal anti-inflammatory medication such as ibuprofen  or naproxen  according to the package instructions.  Perform the exercises given in your discharge packet to help strengthen your forearm and stretch the muscles and tendons to help improve your symptoms.  Rest your hand and wrist is much as possible during the day.  You can apply ice to your wrist for 20 minutes at a time 2-3 times a day to help with pain and swelling.  If your symptoms did not improve, or they worsen, you need to follow-up with orthopedics for additional treatment options or surgery.   If you develop any headache, changes in vision, or weakness in any of your extremities you need to call 911 and go to the ER.

## 2024-03-16 ENCOUNTER — Ambulatory Visit (HOSPITAL_COMMUNITY): Payer: Self-pay

## 2024-05-05 NOTE — Progress Notes (Addendum)
 PROSTATE BIOPSY  Assisted provider with procedure- Prostate Biopsy.  Procedural Time Out performed by Darian, MA and Brittany, RN and Commodore, MD: 1. Correct patient 2. Correct side and site 3. Correct procedure 4. Correct position 5. Availability of special equipment and imaging studies  Pre and post-prostate biopsy teaching completed and patient/caregiver verbalized understanding.   Pre-Procedure Checklist: 1. IM antibiotic Rocephin  given per MD order. 2. Patient stopped blood thinners at least 5 days prior to procedure. 3. Patient has completed fleets enema morning of procedure. 4. Patient does have a full bladder. 5. Patient does have a driver accompanying them today.  Prostate biopsy consent reviewed with patient.    Prostate biopsy probe number 10 used for procedure.  RN assisted provider with procedure.      Patient was unable to tolerate the procedure procedure was stopped and patient was rescheduled for the OR.

## 2024-05-16 NOTE — Progress Notes (Deleted)
 05/17/2024 7:21 PM   Tony Graves 1965-09-24 [id number]  Referring provider: Steva Clotilda DEL, NP [address] [place],  [place] [address]  Urological history: 1. Prostate cancer  - PSA (08/2023) 3.9  - prostate MRI (09/2023) PIRADS 4 and 3 lesions - fusion biopsy (10/2023) - The PI-RADS 4 lesion returned Gleason 3+4 adenocarcinoma involving 10% of the submitted tissue - followed by [facility]  2. ED - sildenafil 100 mg, on-demand-dosing  No chief complaint on file.  HPI: Tony Graves is a 58 y.o. man who presents today for 3 month follow up with his wife, Aneitra.    Previous records reviewed.   He was evaluated at [facility] on 01/24/2024 for right testicular pain and referred to the ED. Workup revealed UA with hematuria, pyuria, mucus, and crystals; urine culture grew E. coli. He received Rocephin  IM and a 10-day course of doxycycline ; STI testing was negative. Scrotal ultrasound showed right epididymitis with moderate hydrocele. At follow-up, scrotal pain has resolved though swelling persists. He denies urinary or systemic symptoms, but UA remains positive for microhematuria. He is a smoker.  He returns today for three month follow up.    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 If you have any questions, ask your nurse or  doctor.          amLODipine  10 MG tablet Commonly known as: NORVASC  Take 10 mg by mouth daily.   aspirin EC 81 MG tablet Take 81 mg by mouth daily. Swallow whole.   atenolol  25 MG tablet Commonly known as: TENORMIN  Take 1 tablet (25 mg total) by mouth daily.   atorvastatin  10 MG tablet Commonly known as: LIPITOR Take 1 tablet (10 mg total) by mouth daily.   atorvastatin  10 MG tablet Commonly known as: LIPITOR Take 10 mg by mouth daily.   colchicine 0.6 MG tablet Take 0.6 mg by mouth 2 (two) times daily.   cyanocobalamin 1000 MCG tablet Commonly known as: VITAMIN B12 Take 1 tablet (1,000 mcg total) by mouth once daily   hydrochlorothiazide  25 MG tablet Commonly known as: HYDRODIURIL  Take 1 tablet (25 mg total) by mouth daily.   ketorolac  0.5 % ophthalmic solution Commonly known as: ACULAR  Place 1 drop into both eyes every 6 (six) hours.   ondansetron  4 MG disintegrating tablet Commonly known as: ZOFRAN -ODT Take 1 tablet (4 mg total) by mouth every 8 (eight) hours as needed for nausea or vomiting.   predniSONE  10 MG (21) Tbpk tablet Commonly known as: STERAPRED UNI-PAK 21 TAB Take 6 tablets on day 1, 5 tablets day 2, 4 tablets day 3, 3 tablets day 4, 2 tablets day 5, 1 tablet day 6  sildenafil 100 MG tablet Commonly known as: VIAGRA Take one tablet po 60 minutes prior to intercourse   tamsulosin  0.4 MG Caps capsule Commonly known as: FLOMAX  TAKE 1 CAPSULE(0.4 MG) BY MOUTH DAILY   valsartan 40 MG tablet Commonly known as: DIOVAN Take 40 mg by mouth daily.   Vitamin D3 125 MCG (5000 UT) Caps Take 1 capsule by mouth daily.        Allergies:  Allergies  Allergen Reactions   Lisinopril Swelling   Penicillins     Family History: Family History  Problem Relation Age of Onset   Hypertension Mother    Stroke Mother    Cancer Father     Social History: See HPI for pertinent social history  ROS: Pertinent ROS in HPI  Physical Exam: There  were no vitals taken for this visit.  Constitutional:  Well nourished. Alert and oriented, No acute distress. HEENT: [place] AT, moist mucus membranes.  Trachea midline, no masses. Cardiovascular: No clubbing, cyanosis, or edema. Respiratory: Normal respiratory effort, no increased work of breathing. GI: Abdomen is soft, non tender, non distended, no abdominal masses. Liver and spleen not palpable.  No hernias appreciated.  Stool sample for occult testing is not indicated.   GU: No CVA tenderness.  No bladder fullness or masses.  Patient with circumcised/uncircumcised phallus. ***Foreskin easily retracted***  Urethral meatus is patent.  No penile discharge. No penile lesions or rashes. Scrotum without lesions, cysts, rashes and/or edema.  Testicles are located scrotally bilaterally. No masses are appreciated in the testicles. Left and right epididymis are normal. Rectal: Patient with  normal sphincter tone. Anus and perineum without scarring or rashes. No rectal masses are appreciated. Prostate is approximately *** grams, *** nodules are appreciated. Seminal vesicles are normal. Skin: No rashes, bruises or suspicious lesions. Lymph: No cervical or inguinal adenopathy. Neurologic: Grossly intact, no focal deficits, moving all 4 extremities. Psychiatric: Normal mood and affect.   Laboratory Data: See EPIC and HPI  I have reviewed the labs.   Pertinent Imaging: N/A   Assessment & Plan:    1. Right epididymitis -***  2. Prostate cancer - Followed by [facility]  3.  ED - continue sildenafil 100 mg, on-demand-dosing through [facility]  4.  Microscopic hematuria - ***   No follow-ups on file.  These notes generated with voice recognition software. I apologize for typographical errors.  CLOTILDA HELON RIGGERS  [facility] [address]  [address] [place], [place] [address] [phone]

## 2024-05-17 ENCOUNTER — Encounter: Payer: Self-pay | Admitting: Urology

## 2024-05-17 ENCOUNTER — Ambulatory Visit: Admitting: Urology

## 2024-05-17 DIAGNOSIS — C61 Malignant neoplasm of prostate: Secondary | ICD-10-CM

## 2024-05-17 DIAGNOSIS — R3129 Other microscopic hematuria: Secondary | ICD-10-CM

## 2024-05-17 DIAGNOSIS — N451 Epididymitis: Secondary | ICD-10-CM

## 2024-05-17 DIAGNOSIS — N529 Male erectile dysfunction, unspecified: Secondary | ICD-10-CM
